# Patient Record
Sex: Male | Born: 1975 | Race: Asian | Hispanic: No | Marital: Married | Smoking: Never smoker
Health system: Southern US, Community
[De-identification: ages and names within clinical notes are randomized; demographics above are authoritative.]

## PROBLEM LIST (undated history)

## (undated) DIAGNOSIS — E119 Type 2 diabetes mellitus without complications: Secondary | ICD-10-CM

## (undated) DIAGNOSIS — D649 Anemia, unspecified: Secondary | ICD-10-CM

## (undated) DIAGNOSIS — A159 Respiratory tuberculosis unspecified: Secondary | ICD-10-CM

## (undated) HISTORY — DX: Respiratory tuberculosis unspecified: A15.9

## (undated) HISTORY — PX: VASECTOMY: SHX75

---

## 2020-04-20 ENCOUNTER — Ambulatory Visit (INDEPENDENT_AMBULATORY_CARE_PROVIDER_SITE_OTHER): Payer: BC Managed Care – PPO | Admitting: Family Medicine

## 2020-04-20 ENCOUNTER — Other Ambulatory Visit: Payer: Self-pay

## 2020-04-20 ENCOUNTER — Encounter: Payer: Self-pay | Admitting: Family Medicine

## 2020-04-20 VITALS — BP 124/70 | HR 89 | Temp 98.0°F | Resp 18 | Ht 65.0 in | Wt 252.6 lb

## 2020-04-20 DIAGNOSIS — E785 Hyperlipidemia, unspecified: Secondary | ICD-10-CM | POA: Diagnosis not present

## 2020-04-20 DIAGNOSIS — Z6841 Body Mass Index (BMI) 40.0 and over, adult: Secondary | ICD-10-CM | POA: Diagnosis not present

## 2020-04-20 DIAGNOSIS — E1169 Type 2 diabetes mellitus with other specified complication: Secondary | ICD-10-CM | POA: Insufficient documentation

## 2020-04-20 DIAGNOSIS — E1165 Type 2 diabetes mellitus with hyperglycemia: Secondary | ICD-10-CM | POA: Insufficient documentation

## 2020-04-20 DIAGNOSIS — Z0001 Encounter for general adult medical examination with abnormal findings: Secondary | ICD-10-CM | POA: Diagnosis not present

## 2020-04-20 MED ORDER — ATORVASTATIN CALCIUM 40 MG PO TABS: 40.0000 mg | ORAL_TABLET | Freq: Every day | ORAL | 3 refills | Status: DC

## 2020-04-20 MED ORDER — SITAGLIPTIN PHOSPHATE 100 MG PO TABS: 100.0000 mg | ORAL_TABLET | Freq: Every day | ORAL | 3 refills | Status: DC

## 2020-04-20 MED ORDER — METFORMIN HCL 1000 MG PO TABS: 1000.0000 mg | ORAL_TABLET | Freq: Two times a day (BID) | ORAL | 3 refills | Status: DC

## 2020-04-20 NOTE — Assessment & Plan Note (Signed)
Check A1c.  Continue Metformin 1000 mg twice daily and Januvia 100 mg daily.  Discussed diet and exercise.

## 2020-04-20 NOTE — Assessment & Plan Note (Signed)
Check lipids.  Continue Lipitor 40 mg daily.  Discussed lifestyle modifications.

## 2020-04-20 NOTE — Patient Instructions (Signed)
It was very nice to see you today!  We will check blood work today.  I will refill your medications.  I will see you back in 3 to 6 months depending on the results of your blood work.  Please come back to see me sooner if needed.  Take care, Dr Jerline Pain  Please try these tips to maintain a healthy lifestyle:   Eat at least 3 REAL meals and 1-2 snacks per day.  Aim for no more than 5 hours between eating.  If you eat breakfast, please do so within one hour of getting up.    Each meal should contain half fruits/vegetables, one quarter protein, and one quarter carbs (no bigger than a computer mouse)   Cut down on sweet beverages. This includes juice, soda, and sweet tea.     Drink at least 1 glass of water with each meal and aim for at least 8 glasses per day   Exercise at least 150 minutes every week.    Preventive Care 51-30 Years Old, Male Preventive care refers to lifestyle choices and visits with your health care provider that can promote health and wellness. This includes:  A yearly physical exam. This is also called an annual well check.  Regular dental and eye exams.  Immunizations.  Screening for certain conditions.  Healthy lifestyle choices, such as eating a healthy diet, getting regular exercise, not using drugs or products that contain nicotine and tobacco, and limiting alcohol use. What can I expect for my preventive care visit? Physical exam Your health care provider will check:  Height and weight. These may be used to calculate body mass index (BMI), which is a measurement that tells if you are at a healthy weight.  Heart rate and blood pressure.  Your skin for abnormal spots. Counseling Your health care provider may ask you questions about:  Alcohol, tobacco, and drug use.  Emotional well-being.  Home and relationship well-being.  Sexual activity.  Eating habits.  Work and work Statistician. What immunizations do I need?  Influenza (flu)  vaccine  This is recommended every year. Tetanus, diphtheria, and pertussis (Tdap) vaccine  You may need a Td booster every 10 years. Varicella (chickenpox) vaccine  You may need this vaccine if you have not already been vaccinated. Zoster (shingles) vaccine  You may need this after age 23. Measles, mumps, and rubella (MMR) vaccine  You may need at least one dose of MMR if you were born in 1957 or later. You may also need a second dose. Pneumococcal conjugate (PCV13) vaccine  You may need this if you have certain conditions and were not previously vaccinated. Pneumococcal polysaccharide (PPSV23) vaccine  You may need one or two doses if you smoke cigarettes or if you have certain conditions. Meningococcal conjugate (MenACWY) vaccine  You may need this if you have certain conditions. Hepatitis A vaccine  You may need this if you have certain conditions or if you travel or work in places where you may be exposed to hepatitis A. Hepatitis B vaccine  You may need this if you have certain conditions or if you travel or work in places where you may be exposed to hepatitis B. Haemophilus influenzae type b (Hib) vaccine  You may need this if you have certain risk factors. Human papillomavirus (HPV) vaccine  If recommended by your health care provider, you may need three doses over 6 months. You may receive vaccines as individual doses or as more than one vaccine together in one  shot (combination vaccines). Talk with your health care provider about the risks and benefits of combination vaccines. What tests do I need? Blood tests  Lipid and cholesterol levels. These may be checked every 5 years, or more frequently if you are over 28 years old.  Hepatitis C test.  Hepatitis B test. Screening  Lung cancer screening. You may have this screening every year starting at age 61 if you have a 30-pack-year history of smoking and currently smoke or have quit within the past 15  years.  Prostate cancer screening. Recommendations will vary depending on your family history and other risks.  Colorectal cancer screening. All adults should have this screening starting at age 46 and continuing until age 40. Your health care provider may recommend screening at age 66 if you are at increased risk. You will have tests every 1-10 years, depending on your results and the type of screening test.  Diabetes screening. This is done by checking your blood sugar (glucose) after you have not eaten for a while (fasting). You may have this done every 1-3 years.  Sexually transmitted disease (STD) testing. Follow these instructions at home: Eating and drinking  Eat a diet that includes fresh fruits and vegetables, whole grains, lean protein, and low-fat dairy products.  Take vitamin and mineral supplements as recommended by your health care provider.  Do not drink alcohol if your health care provider tells you not to drink.  If you drink alcohol: ? Limit how much you have to 0-2 drinks a day. ? Be aware of how much alcohol is in your drink. In the [place] one drink equals one 12 oz bottle of beer (355 mL), one 5 oz glass of wine (148 mL), or one 1 oz glass of hard liquor (44 mL). Lifestyle  Take daily care of your teeth and gums.  Stay active. Exercise for at least 30 minutes on 5 or more days each week.  Do not use any products that contain nicotine or tobacco, such as cigarettes, e-cigarettes, and chewing tobacco. If you need help quitting, ask your health care provider.  If you are sexually active, practice safe sex. Use a condom or other form of protection to prevent STIs (sexually transmitted infections).  Talk with your health care provider about taking a low-dose aspirin every day starting at age 53. What's next?  Go to your health care provider once a year for a well check visit.  Ask your health care provider how often you should have your eyes and teeth  checked.  Stay up to date on all vaccines. This information is not intended to replace advice given to you by your health care provider. Make sure you discuss any questions you have with your health care provider. Document Revised: 04/05/2018 Document Reviewed: 04/05/2018 Elsevier Patient Education  2020 [employer].

## 2020-04-20 NOTE — Progress Notes (Signed)
Chief Complaint:  Cody Marks is a 44 y.o. male who presents today for his annual comprehensive physical exam.  He is a new patient.   Assessment/Plan:  Chronic Problems Addressed Today: Type 2 diabetes mellitus with hyperglycemia (HCC) Check A1c.  Continue Metformin 1000 mg twice daily and Januvia 100 mg daily.  Discussed diet and exercise.  Dyslipidemia Check lipids.  Continue Lipitor 40 mg daily.  Discussed lifestyle modifications.  Body mass index is 42.03 kg/m. / Obese  BMI Metric Follow Up - 04/20/20 1552      BMI Metric Follow Up-Please document annually   BMI Metric Follow Up Education provided            Preventative Healthcare: Will obtain records from previous PCP.  Check CBC, CMET, TSH, lipid panel.  Patient Counseling(The following topics were reviewed and/or handout was given):  -Nutrition: Stressed importance of moderation in sodium/caffeine intake, saturated fat and cholesterol, caloric balance, sufficient intake of fresh fruits, vegetables, and fiber.  -Stressed the importance of regular exercise.   -Substance Abuse: Discussed cessation/primary prevention of tobacco, alcohol, or other drug use; driving or other dangerous activities under the influence; availability of treatment for abuse.   -Injury prevention: Discussed safety belts, safety helmets, smoke detector, smoking near bedding or upholstery.   -Sexuality: Discussed sexually transmitted diseases, partner selection, use of condoms, avoidance of unintended pregnancy and contraceptive alternatives.   -Dental health: Discussed importance of regular tooth brushing, flossing, and dental visits.  -Health maintenance and immunizations reviewed. Please refer to Health maintenance section.  Return to care in 1 year for next preventative visit.     Subjective:  HPI:  He has no acute complaints today.   Lifestyle Diet: None specific.  Exercise: None specific.   Depression screen PHQ 2/9 04/20/2020   Decreased Interest 0  Down, Depressed, Hopeless 0  PHQ - 2 Score 0    Health Maintenance Due  Topic Date Due  . HEMOGLOBIN A1C  Never done  . Hepatitis C Screening  Never done  . PNEUMOCOCCAL POLYSACCHARIDE VACCINE AGE 23-64 HIGH RISK  Never done  . FOOT EXAM  Never done  . OPHTHALMOLOGY EXAM  Never done  . HIV Screening  Never done  . TETANUS/TDAP  Never done     ROS: Per HPI, otherwise a complete review of systems was negative.   PMH:  The following were reviewed and entered/updated in epic: Past Medical History:  Diagnosis Date  . Tuberculosis    Patient Active Problem List   Diagnosis Date Noted  . Dyslipidemia 04/20/2020  . Type 2 diabetes mellitus with hyperglycemia (HCC) 04/20/2020   History reviewed. No pertinent surgical history.  Family History  Problem Relation Age of Onset  . COPD Mother     Medications- reviewed and updated Current Outpatient Medications  Medication Sig Dispense Refill  . atorvastatin (LIPITOR) 40 MG tablet Take 1 tablet (40 mg total) by mouth daily. 90 tablet 3  . metFORMIN (GLUCOPHAGE) 1000 MG tablet Take 1 tablet (1,000 mg total) by mouth 2 (two) times daily. 90 tablet 3  . sitaGLIPtin (JANUVIA) 100 MG tablet Take 1 tablet (100 mg total) by mouth daily. 90 tablet 3   No current facility-administered medications for this visit.    Allergies-reviewed and updated No Known Allergies  Social History   Socioeconomic History  . Marital status: Married    Spouse name: Not on file  . Number of children: Not on file  . Years of education: Not on file  .  Highest education level: Not on file  Occupational History  . Not on file  Tobacco Use  . Smoking status: Never Smoker  . Smokeless tobacco: Never Used  Substance and Sexual Activity  . Alcohol use: Not on file  . Drug use: Not on file  . Sexual activity: Not on file  Other Topics Concern  . Not on file  Social History Narrative  . Not on file   Social Determinants of  Health   Financial Resource Strain: Not on file  Food Insecurity: Not on file  Transportation Needs: Not on file  Physical Activity: Not on file  Stress: Not on file  Social Connections: Not on file        Objective:  Physical Exam: BP 124/70   Pulse 89   Temp 98 F (36.7 C) (Temporal)   Resp 18   Ht 5\' 5"  (1.651 m)   Wt 252 lb 9.6 oz (114.6 kg)   SpO2 96%   BMI 42.03 kg/m   Body mass index is 42.03 kg/m. Wt Readings from Last 3 Encounters:  04/20/20 252 lb 9.6 oz (114.6 kg)   Gen: NAD, resting comfortably HEENT: TMs normal bilaterally. OP clear. No thyromegaly noted.  CV: RRR with no murmurs appreciated Pulm: NWOB, CTAB with no crackles, wheezes, or rhonchi GI: Normal bowel sounds present. Soft, Nontender, Nondistended. MSK: no edema, cyanosis, or clubbing noted Skin: warm, dry Neuro: CN2-12 grossly intact. Strength 5/5 in upper and lower extremities. Reflexes symmetric and intact bilaterally.  Psych: Normal affect and thought content     [name]. 04/22/20, MD 04/20/2020 3:53 PM

## 2020-04-21 LAB — CBC
HCT: 38.7 % — ABNORMAL LOW (ref 39.0–52.0)
Hemoglobin: 12.8 g/dL — ABNORMAL LOW (ref 13.0–17.0)
MCHC: 33 g/dL (ref 30.0–36.0)
MCV: 82.9 fl (ref 78.0–100.0)
Platelets: 275 10*3/uL (ref 150.0–400.0)
RBC: 4.67 Mil/uL (ref 4.22–5.81)
RDW: 13.6 % (ref 11.5–15.5)
WBC: 9.4 10*3/uL (ref 4.0–10.5)

## 2020-04-21 LAB — LIPID PANEL
Cholesterol: 160 mg/dL (ref 0–200)
HDL: 41.7 mg/dL (ref >39.00)
LDL Cholesterol: 94 mg/dL (ref 0–99)
NonHDL: 118.55
Total CHOL/HDL Ratio: 4
Triglycerides: 122 mg/dL (ref 0.0–149.0)
VLDL: 24.4 mg/dL (ref 0.0–40.0)

## 2020-04-21 LAB — COMPREHENSIVE METABOLIC PANEL
ALT: 23 U/L (ref 0–53)
AST: 17 U/L (ref 0–37)
Albumin: 4.1 g/dL (ref 3.5–5.2)
Alkaline Phosphatase: 82 U/L (ref 39–117)
BUN: 13 mg/dL (ref 6–23)
CO2: 31 mEq/L (ref 19–32)
Calcium: 9.5 mg/dL (ref 8.4–10.5)
Chloride: 98 mEq/L (ref 96–112)
Creatinine, Ser: 0.82 mg/dL (ref 0.40–1.50)
GFR: 108.35 mL/min (ref >60.00)
Glucose, Bld: 120 mg/dL — ABNORMAL HIGH (ref 70–99)
Potassium: 4.3 mEq/L (ref 3.5–5.1)
Sodium: 134 mEq/L — ABNORMAL LOW (ref 135–145)
Total Bilirubin: 0.4 mg/dL (ref 0.2–1.2)
Total Protein: 7.8 g/dL (ref 6.0–8.3)

## 2020-04-21 LAB — MICROALBUMIN / CREATININE URINE RATIO
Creatinine,U: 73.5 mg/dL
Microalb Creat Ratio: 1 mg/g (ref 0.0–30.0)
Microalb, Ur: <0.7 mg/dL (ref 0.0–1.9)

## 2020-04-21 LAB — LDL CHOLESTEROL, DIRECT: Direct LDL: 105 mg/dL

## 2020-04-21 LAB — HEMOGLOBIN A1C: Hgb A1c MFr Bld: 8.7 % — ABNORMAL HIGH (ref 4.6–6.5)

## 2020-04-21 LAB — TSH: TSH: 2.58 u[IU]/mL (ref 0.35–4.50)

## 2020-04-21 NOTE — Progress Notes (Signed)
Please inform patient of the following:  A1c and blood sugar are high but everything else is stable. Recommend addition on jardiance 10mg  daily to his medications to help lower sugar. Please send in if he is willing to start. I would like to see him back in 3 months to recheck.  . Katina Degree, MD 04/21/2020 1:24 PM

## 2020-04-28 ENCOUNTER — Other Ambulatory Visit: Payer: Self-pay | Admitting: *Deleted

## 2020-04-28 MED ORDER — EMPAGLIFLOZIN 10 MG PO TABS: 10.0000 mg | ORAL_TABLET | Freq: Every day | ORAL | 0 refills | Status: DC

## 2020-08-18 ENCOUNTER — Encounter: Payer: Self-pay | Admitting: Family Medicine

## 2020-08-18 ENCOUNTER — Other Ambulatory Visit: Payer: Self-pay

## 2020-08-18 ENCOUNTER — Ambulatory Visit (INDEPENDENT_AMBULATORY_CARE_PROVIDER_SITE_OTHER): Payer: BC Managed Care – PPO | Admitting: Family Medicine

## 2020-08-18 VITALS — BP 120/77 | HR 92 | Temp 97.7°F | Ht 65.0 in | Wt 254.2 lb

## 2020-08-18 DIAGNOSIS — E785 Hyperlipidemia, unspecified: Secondary | ICD-10-CM | POA: Diagnosis not present

## 2020-08-18 DIAGNOSIS — E1165 Type 2 diabetes mellitus with hyperglycemia: Secondary | ICD-10-CM | POA: Diagnosis not present

## 2020-08-18 LAB — POCT GLYCOSYLATED HEMOGLOBIN (HGB A1C): Hemoglobin A1C: 8.3 % — AB (ref 4.0–5.6)

## 2020-08-18 NOTE — Progress Notes (Signed)
   Jair Lindblad is a 45 y.o. male who presents today for an office visit.  Assessment/Plan:  Chronic Problems Addressed Today: Type 2 diabetes mellitus with hyperglycemia (HCC) A1c is better but still above goal.  He has been working on diet and exercise.  We will continue current regimen with metformin 1000 mg twice daily and Januvia 20 mg daily.  He was  unable to afford Jardiance due to cost.  Dyslipidemia Doing well with Lipitor 40 mg daily.    Subjective:  HPI:  See A/p.         Objective:  Physical Exam: BP 120/77   Pulse 92   Temp 97.7 F (36.5 C) (Temporal)   Ht 5\' 5"  (1.651 m)   Wt 254 lb 3.2 oz (115.3 kg)   SpO2 97%   BMI 42.30 kg/m   Gen: No acute distress, resting comfortably ro: Grossly normal, moves all extremities Psych: Normal affect and thought content      Thalya Fouche M. , MD 08/18/2020 3:31 PM

## 2020-08-18 NOTE — Assessment & Plan Note (Signed)
Doing well with Lipitor 40 mg daily.

## 2020-08-18 NOTE — Assessment & Plan Note (Signed)
A1c is better but still above goal.  He has been working on diet and exercise.  We will continue current regimen with metformin 1000 mg twice daily and Januvia 20 mg daily.  He was  unable to afford Jardiance due to cost.

## 2020-08-18 NOTE — Patient Instructions (Addendum)
It was very nice to see you today!  Your blood sugar looks better.  Please continue working on diet and exercise.  No medication changes today.  I will see you back in 3 to 6 months to recheck A1c.    Please check with your insurance to see if you can get a formulary in case we need to make adjustments in the future.   Come back to see me sooner if needed.  Take care, Dr Jimmey Ralph  PLEASE NOTE:  If you had any lab tests please let us know if you have not heard back within a few days. You may see your results on mychart before we have a chance to review them but we will give you a call once they are reviewed by [place]. If we ordered any referrals today, please let us know if you have not heard from their office within the next week.   Please try these tips to maintain a healthy lifestyle:   Eat at least 3 REAL meals and 1-2 snacks per day.  Aim for no more than 5 hours between eating.  If you eat breakfast, please do so within one hour of getting up.    Each meal should contain half fruits/vegetables, one quarter protein, and one quarter carbs (no bigger than a computer mouse)   Cut down on sweet beverages. This includes juice, soda, and sweet tea.     Drink at least 1 glass of water with each meal and aim for at least 8 glasses per day   Exercise at least 150 minutes every week.

## 2021-01-01 ENCOUNTER — Ambulatory Visit (INDEPENDENT_AMBULATORY_CARE_PROVIDER_SITE_OTHER): Payer: BC Managed Care – PPO | Admitting: Family Medicine

## 2021-01-01 ENCOUNTER — Other Ambulatory Visit: Payer: Self-pay

## 2021-01-01 ENCOUNTER — Encounter: Payer: Self-pay | Admitting: Family Medicine

## 2021-01-01 VITALS — BP 130/84 | HR 89 | Temp 98.0°F | Ht 65.0 in | Wt 246.0 lb

## 2021-01-01 DIAGNOSIS — Z3009 Encounter for other general counseling and advice on contraception: Secondary | ICD-10-CM

## 2021-01-01 DIAGNOSIS — E1165 Type 2 diabetes mellitus with hyperglycemia: Secondary | ICD-10-CM

## 2021-01-01 LAB — POCT GLYCOSYLATED HEMOGLOBIN (HGB A1C): Hemoglobin A1C: 8.2 % — AB (ref 4.0–5.6)

## 2021-01-01 MED ORDER — TADALAFIL 10 MG PO TABS: 5.0000 mg | ORAL_TABLET | ORAL | 1 refills | Status: DC | PRN

## 2021-01-01 NOTE — Patient Instructions (Signed)
It was very nice to see you today!  I will refer you to see a urologist to discuss a vasectomy.  Please start the Cialis.  I will see back in 3 to 6 months.  Come back to see me sooner if needed.  Take care, Dr Jimmey Ralph  PLEASE NOTE:  If you had any lab tests please let us know if you have not heard back within a few days. You may see your results on mychart before we have a chance to review them but we will give you a call once they are reviewed by [place]. If we ordered any referrals today, please let us know if you have not heard from their office within the next week.   Please try these tips to maintain a healthy lifestyle:  Eat at least 3 REAL meals and 1-2 snacks per day.  Aim for no more than 5 hours between eating.  If you eat breakfast, please do so within one hour of getting up.   Each meal should contain half fruits/vegetables, one quarter protein, and one quarter carbs (no bigger than a computer mouse)  Cut down on sweet beverages. This includes juice, soda, and sweet tea.   Drink at least 1 glass of water with each meal and aim for at least 8 glasses per day  Exercise at least 150 minutes every week.

## 2021-01-01 NOTE — Progress Notes (Signed)
   Cody Marks is a 45 y.o. male who presents today for an office visit.  Assessment/Plan:  Chronic Problems Addressed Today: Morbid obesity (HCC) BMI 48.  He is working on diet and exercise.  May benefit from GLP-1 agonist or Mounjaro in the future.  We will follow-up again in 3 to 6 months.  Type 2 diabetes mellitus with hyperglycemia (HCC) A1c stable at 8.3.  Discussed with patient this is slightly above goal.  He has been having trouble getting his medications paid for by his insurance.  We will continue metformin 1000 mg twice daily and recheck again in 3 to 6 months.  We discussed lifestyle modifications.  He will be switching insurances at the start of the nadir.  May consider adding on Ozempic or Mounjaro at that time.  Flu vaccine given today.     Subjective:  HPI:  See A/p for status of chronic conditions.   As part of the plan to lose weight he has also been adjusting his diet and caloric intake.  Also, he has an issue with maintaining, and/or acquiring an erection, depending on the day. Additionally he expresses interest in having a vasectomy.         Objective:  Physical Exam: BP 130/84   Pulse 89   Temp 98 F (36.7 C) (Temporal)   Ht 5\' 5"  (1.651 m)   Wt 246 lb (111.6 kg)   SpO2 99%   BMI 40.94 kg/m   Gen: No acute distress, resting comfortably CV: Regular rate and rhythm with no murmurs appreciated Pulm: Normal work of breathing, clear to auscultation bilaterally with no crackles, wheezes, or rhonchi Neuro: Grossly normal, moves all extremities Psych: Normal affect and thought content      I,Jordan Kelly,acting as a scribe for , MD.,have documented all relevant documentation on the behalf of Jacquiline Doe, MD,as directed by  Jacquiline Doe, MD while in the presence of Jacquiline Doe, MD.  I, Jacquiline Doe, MD, have reviewed all documentation for this visit. The documentation on 01/01/21 for the exam, diagnosis, procedures, and orders are all  accurate and complete.  03/03/21. Katina Degree, MD 01/01/2021 3:43 PM

## 2021-01-01 NOTE — Assessment & Plan Note (Signed)
BMI 48.  He is working on diet and exercise.  May benefit from GLP-1 agonist or Mounjaro in the future.  We will follow-up again in 3 to 6 months.

## 2021-01-01 NOTE — Assessment & Plan Note (Signed)
A1c stable at 8.3.  Discussed with patient this is slightly above goal.  He has been having trouble getting his medications paid for by his insurance.  We will continue metformin 1000 mg twice daily and recheck again in 3 to 6 months.  We discussed lifestyle modifications.  He will be switching insurances at the start of the nadir.  May consider adding on Ozempic or Mounjaro at that time.

## 2021-05-07 ENCOUNTER — Encounter: Payer: Self-pay | Admitting: Family Medicine

## 2021-05-07 ENCOUNTER — Other Ambulatory Visit: Payer: Self-pay

## 2021-05-07 ENCOUNTER — Ambulatory Visit (INDEPENDENT_AMBULATORY_CARE_PROVIDER_SITE_OTHER): Payer: BC Managed Care – PPO | Admitting: Family Medicine

## 2021-05-07 DIAGNOSIS — E1165 Type 2 diabetes mellitus with hyperglycemia: Secondary | ICD-10-CM | POA: Diagnosis not present

## 2021-05-07 LAB — POCT GLYCOSYLATED HEMOGLOBIN (HGB A1C): Hemoglobin A1C: 9.5 % — AB (ref 4.0–5.6)

## 2021-05-07 MED ORDER — OZEMPIC (0.25 OR 0.5 MG/DOSE) 2 MG/1.5ML ~~LOC~~ SOPN: 0.2500 mg | PEN_INJECTOR | SUBCUTANEOUS | 3 refills | Status: DC

## 2021-05-07 NOTE — Assessment & Plan Note (Signed)
A1c uncontrolled at 9.5.  We will start Ozempic 0.25 mg weekly for 4 weeks and then increase to 0.5 mg weekly.  Discussed potential side effects.  He will follow-up in a few weeks via MyChart.  Follow-up in 3 months to recheck A1c.

## 2021-05-07 NOTE — Patient Instructions (Addendum)
It was very nice to see you today!  Your A1c is 9.5.  We need to work to get this lower.  We will start Ozempic.  Please take 0.25 mg weekly for 4 weeks and then increase to 0.5 mg weekly.  Please send me a message in a few weeks to let me know how this is working.  Please continue to work on diet and exercise.   Please come back in 3 months or sooner if needed.   Take care, Dr Jimmey Ralph  PLEASE NOTE:  If you had any lab tests please let us know if you have not heard back within a few days. You may see your results on mychart before we have a chance to review them but we will give you a call once they are reviewed by [place]. If we ordered any referrals today, please let us know if you have not heard from their office within the next week.   Please try these tips to maintain a healthy lifestyle:  Eat at least 3 REAL meals and 1-2 snacks per day.  Aim for no more than 5 hours between eating.  If you eat breakfast, please do so within one hour of getting up.   Each meal should contain half fruits/vegetables, one quarter protein, and one quarter carbs (no bigger than a computer mouse)  Cut down on sweet beverages. This includes juice, soda, and sweet tea.   Drink at least 1 glass of water with each meal and aim for at least 8 glasses per day  Exercise at least 150 minutes every week.

## 2021-05-07 NOTE — Assessment & Plan Note (Signed)
Down about 8 lbs since last visit. Discussed lifestyle modifications.

## 2021-05-07 NOTE — Progress Notes (Signed)
° °  Cody Marks is a 46 y.o. male who presents today for an office visit.  Assessment/Plan:  Chronic Problems Addressed Today: Morbid obesity (HCC) Down about 8 lbs since last visit. Discussed lifestyle modifications.   Type 2 diabetes mellitus with hyperglycemia (HCC) A1c uncontrolled at 9.5.  We will start Ozempic 0.25 mg weekly for 4 weeks and then increase to 0.5 mg weekly.  Discussed potential side effects.  He will follow-up in a few weeks via MyChart.  Follow-up in 3 months to recheck A1c.    Subjective:  HPI:  See A/p for status of chronic conditions.        Objective:  Physical Exam: BP 134/82 (BP Location: Left Arm, Patient Position: Sitting, Cuff Size: Large)    Pulse 92    Temp (!) 97.5 F (36.4 C) (Temporal)    Ht 5\' 5"  (1.651 m)    Wt 238 lb (108 kg)    SpO2 98%    BMI 39.61 kg/m   Gen: No acute distress, resting comfortably CV: Regular rate and rhythm with no murmurs appreciated Pulm: Normal work of breathing, clear to auscultation bilaterally with no crackles, wheezes, or rhonchi Neuro: Grossly normal, moves all extremities Psych: Normal affect and thought content      Cody Marks M. Jerline Pain, MD 05/07/2021 3:40 PM

## 2021-05-11 ENCOUNTER — Telehealth: Payer: Self-pay | Admitting: *Deleted

## 2021-05-11 NOTE — Telephone Encounter (Signed)
PA (Key: [id number]) Rx #: [id number] Ozempic (0.25 or 0.5 MG/DOSE) 2MG /1.5ML pen-injectors Waiting for determination

## 2021-05-13 NOTE — Telephone Encounter (Signed)
PA Rx Ozempic approved from 05/11/2021 till 05/11/2022 Pharmacy notified  stated Rx is more then $700 with approval   Patient notified, will call insurance for information of medication changes and approval

## 2021-06-08 ENCOUNTER — Telehealth (INDEPENDENT_AMBULATORY_CARE_PROVIDER_SITE_OTHER): Payer: BC Managed Care – PPO | Admitting: Physician Assistant

## 2021-06-08 VITALS — Ht 65.0 in

## 2021-06-08 DIAGNOSIS — R6889 Other general symptoms and signs: Secondary | ICD-10-CM | POA: Diagnosis not present

## 2021-06-08 DIAGNOSIS — J029 Acute pharyngitis, unspecified: Secondary | ICD-10-CM

## 2021-06-08 NOTE — Progress Notes (Signed)
° °  Virtual Visit via Video Note  I connected with  Cody Marks  on 06/08/21 at  8:30 AM EST by a video enabled telemedicine application and verified that I am speaking with the correct person using two identifiers.  Location: Patient: Parked car driving to work  Provider: Nature conservation officer at [employer] Persons present: Patient and myself   I discussed the limitations of evaluation and management by telemedicine and the availability of in person appointments. The patient expressed understanding and agreed to proceed.   History of Present Illness:  Chief complaint: ST and cough Symptom onset: 2 days ago Pertinent positives: Some body aches, fatigue, chills Pertinent negatives: SOB, CP, N/V/D, fever Treatments tried: Tylenol or Ibuprofen at home Vaccine status: Pfizer x 3 Sick exposure: Family in household with similar symptoms this week     Observations/Objective:   Gen: Awake, alert, no acute distress Resp: Breathing is even and non-labored Psych: calm/pleasant demeanor Neuro: Alert and Oriented x 3, + facial symmetry, speech is clear.   Assessment and Plan: 1. Flu-like symptoms 2. Acute pharyngitis, unspecified etiology -Encouraged pt to come to our office for strep, flu, and COVID-19 testing, but he said he needed to go into work. -At this time, would recommend conservative tx for likely viral infection. -Tylenol / ibuprofen prn, fluids, nasal saline, Delsym OTC -Recheck in person if worse or no improvement.   Follow Up Instructions:    I discussed the assessment and treatment plan with the patient. The patient was provided an opportunity to ask questions and all were answered. The patient agreed with the plan and demonstrated an understanding of the instructions.   The patient was advised to call back or seek an in-person evaluation if the symptoms worsen or if the condition fails to improve as anticipated.  Asheley Hellberg M Casilda Pickerill, PA-C

## 2021-06-15 ENCOUNTER — Telehealth: Payer: Self-pay | Admitting: Family Medicine

## 2021-06-15 ENCOUNTER — Emergency Department (HOSPITAL_BASED_OUTPATIENT_CLINIC_OR_DEPARTMENT_OTHER)
Admission: EM | Admit: 2021-06-15 | Discharge: 2021-06-15 | Payer: BC Managed Care – PPO | Attending: Emergency Medicine | Admitting: Emergency Medicine

## 2021-06-15 ENCOUNTER — Emergency Department (HOSPITAL_BASED_OUTPATIENT_CLINIC_OR_DEPARTMENT_OTHER): Payer: BC Managed Care – PPO

## 2021-06-15 ENCOUNTER — Encounter (HOSPITAL_BASED_OUTPATIENT_CLINIC_OR_DEPARTMENT_OTHER): Payer: Self-pay

## 2021-06-15 ENCOUNTER — Encounter: Payer: Self-pay | Admitting: Family Medicine

## 2021-06-15 ENCOUNTER — Ambulatory Visit (INDEPENDENT_AMBULATORY_CARE_PROVIDER_SITE_OTHER): Payer: BC Managed Care – PPO | Admitting: Family Medicine

## 2021-06-15 ENCOUNTER — Other Ambulatory Visit: Payer: Self-pay

## 2021-06-15 VITALS — BP 98/68 | HR 118 | Temp 97.7°F | Ht 65.0 in | Wt 233.2 lb

## 2021-06-15 DIAGNOSIS — Z794 Long term (current) use of insulin: Secondary | ICD-10-CM | POA: Insufficient documentation

## 2021-06-15 DIAGNOSIS — L02215 Cutaneous abscess of perineum: Secondary | ICD-10-CM

## 2021-06-15 DIAGNOSIS — A419 Sepsis, unspecified organism: Secondary | ICD-10-CM | POA: Insufficient documentation

## 2021-06-15 DIAGNOSIS — N493 Fournier gangrene: Secondary | ICD-10-CM | POA: Diagnosis not present

## 2021-06-15 DIAGNOSIS — E871 Hypo-osmolality and hyponatremia: Secondary | ICD-10-CM | POA: Diagnosis not present

## 2021-06-15 DIAGNOSIS — E1165 Type 2 diabetes mellitus with hyperglycemia: Secondary | ICD-10-CM | POA: Insufficient documentation

## 2021-06-15 DIAGNOSIS — R000 Tachycardia, unspecified: Secondary | ICD-10-CM | POA: Insufficient documentation

## 2021-06-15 DIAGNOSIS — D72829 Elevated white blood cell count, unspecified: Secondary | ICD-10-CM | POA: Insufficient documentation

## 2021-06-15 DIAGNOSIS — R509 Fever, unspecified: Secondary | ICD-10-CM | POA: Diagnosis present

## 2021-06-15 DIAGNOSIS — Z20822 Contact with and (suspected) exposure to covid-19: Secondary | ICD-10-CM | POA: Diagnosis not present

## 2021-06-15 LAB — COMPREHENSIVE METABOLIC PANEL
ALT: 28 U/L (ref 0–44)
AST: 21 U/L (ref 15–41)
Albumin: 3.7 g/dL (ref 3.5–5.0)
Alkaline Phosphatase: 70 U/L (ref 38–126)
Anion gap: 10 (ref 5–15)
BUN: 13 mg/dL (ref 6–20)
CO2: 22 mmol/L (ref 22–32)
Calcium: 9.3 mg/dL (ref 8.9–10.3)
Chloride: 101 mmol/L (ref 98–111)
Creatinine, Ser: 0.77 mg/dL (ref 0.61–1.24)
GFR, Estimated: >60 mL/min (ref >60)
Glucose, Bld: 272 mg/dL — ABNORMAL HIGH (ref 70–99)
Potassium: 3.8 mmol/L (ref 3.5–5.1)
Sodium: 133 mmol/L — ABNORMAL LOW (ref 135–145)
Total Bilirubin: 0.7 mg/dL (ref 0.3–1.2)
Total Protein: 7.4 g/dL (ref 6.5–8.1)

## 2021-06-15 LAB — CBC WITH DIFFERENTIAL/PLATELET
Abs Immature Granulocytes: 0.04 10*3/uL (ref 0.00–0.07)
Basophils Absolute: 0 10*3/uL (ref 0.0–0.1)
Basophils Relative: 0 %
Eosinophils Absolute: 0 10*3/uL (ref 0.0–0.5)
Eosinophils Relative: 0 %
HCT: 37.2 % — ABNORMAL LOW (ref 39.0–52.0)
Hemoglobin: 12 g/dL — ABNORMAL LOW (ref 13.0–17.0)
Immature Granulocytes: 0 %
Lymphocytes Relative: 21 %
Lymphs Abs: 2.6 10*3/uL (ref 0.7–4.0)
MCH: 26.3 pg (ref 26.0–34.0)
MCHC: 32.3 g/dL (ref 30.0–36.0)
MCV: 81.6 fL (ref 80.0–100.0)
Monocytes Absolute: 0.7 10*3/uL (ref 0.1–1.0)
Monocytes Relative: 6 %
Neutro Abs: 8.9 10*3/uL — ABNORMAL HIGH (ref 1.7–7.7)
Neutrophils Relative %: 73 %
Platelets: 271 10*3/uL (ref 150–400)
RBC: 4.56 MIL/uL (ref 4.22–5.81)
RDW: 12.9 % (ref 11.5–15.5)
WBC: 12.3 10*3/uL — ABNORMAL HIGH (ref 4.0–10.5)
nRBC: 0 % (ref 0.0–0.2)

## 2021-06-15 LAB — RESP PANEL BY RT-PCR (FLU A&B, COVID) ARPGX2
Influenza A by PCR: NEGATIVE
Influenza B by PCR: NEGATIVE
SARS Coronavirus 2 by RT PCR: NEGATIVE

## 2021-06-15 LAB — URINALYSIS, ROUTINE W REFLEX MICROSCOPIC
Bilirubin Urine: NEGATIVE
Glucose, UA: >1000 mg/dL — AB
Hgb urine dipstick: NEGATIVE
Ketones, ur: NEGATIVE mg/dL
Leukocytes,Ua: NEGATIVE
Nitrite: NEGATIVE
Protein, ur: NEGATIVE mg/dL
Specific Gravity, Urine: 1.032 — ABNORMAL HIGH (ref 1.005–1.030)
pH: 6 (ref 5.0–8.0)

## 2021-06-15 LAB — PROTIME-INR
INR: 1.1 (ref 0.8–1.2)
Prothrombin Time: 14.4 seconds (ref 11.4–15.2)

## 2021-06-15 LAB — LACTIC ACID, PLASMA: Lactic Acid, Venous: 0.9 mmol/L (ref 0.5–1.9)

## 2021-06-15 MED ORDER — VANCOMYCIN HCL IN DEXTROSE 1-5 GM/200ML-% IV SOLN
1000.0000 mg | Freq: Once | INTRAVENOUS | Status: AC
  Administered 2021-06-15: 1000 mg via INTRAVENOUS
  Filled 2021-06-15: qty 200

## 2021-06-15 MED ORDER — PIPERACILLIN-TAZOBACTAM 3.375 G IVPB 30 MIN
3.3750 g | Freq: Once | INTRAVENOUS | Status: AC
  Administered 2021-06-15: 3.375 g via INTRAVENOUS
  Filled 2021-06-15: qty 50

## 2021-06-15 MED ORDER — LACTATED RINGERS IV SOLN: INTRAVENOUS | Status: DC

## 2021-06-15 MED ORDER — CLINDAMYCIN PHOSPHATE 600 MG/50ML IV SOLN
600.0000 mg | Freq: Once | INTRAVENOUS | Status: AC
  Administered 2021-06-15: 600 mg via INTRAVENOUS
  Filled 2021-06-15: qty 50

## 2021-06-15 MED ORDER — DIPHENHYDRAMINE HCL 50 MG/ML IJ SOLN
25.0000 mg | Freq: Once | INTRAMUSCULAR | Status: AC
  Administered 2021-06-15: 25 mg via INTRAVENOUS
  Filled 2021-06-15: qty 1

## 2021-06-15 MED ORDER — VANCOMYCIN HCL 1250 MG/250ML IV SOLN
1250.0000 mg | Freq: Two times a day (BID) | INTRAVENOUS | Status: DC
  Filled 2021-06-15: qty 250

## 2021-06-15 MED ORDER — HYDROMORPHONE HCL 1 MG/ML IJ SOLN
1.0000 mg | Freq: Once | INTRAMUSCULAR | Status: AC
  Administered 2021-06-15: 1 mg via INTRAVENOUS
  Filled 2021-06-15: qty 1

## 2021-06-15 MED ORDER — LACTATED RINGERS IV BOLUS (SEPSIS)
1000.0000 mL | Freq: Once | INTRAVENOUS | Status: AC
  Administered 2021-06-15: 1000 mL via INTRAVENOUS

## 2021-06-15 MED ORDER — IOHEXOL 300 MG/ML  SOLN
100.0000 mL | Freq: Once | INTRAMUSCULAR | Status: AC | PRN
  Administered 2021-06-15: 100 mL via INTRAVENOUS

## 2021-06-15 MED ORDER — LACTATED RINGERS IV BOLUS (SEPSIS)
500.0000 mL | Freq: Once | INTRAVENOUS | Status: AC
  Administered 2021-06-15: 500 mL via INTRAVENOUS

## 2021-06-15 NOTE — ED Notes (Signed)
Pt. Leaving with [facility] to ED to [facility]. Pt. Stable and ready for transport. Transport loading pt. Now.

## 2021-06-15 NOTE — ED Provider Notes (Addendum)
MEDCENTER [facility] EMERGENCY DEPT Provider Note   CSN: [id number] Arrival date & time: 06/15/21  1019     History  Chief Complaint  Patient presents with   Abscess    Cody Marks is a 46 y.o. male.   Abscess  46 year old male with a history of recent drainage of a perineal abscess by Dr. 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  The patient was sent by his PCP to the emergency department.  He has had roughly 5 days of symptoms.  He was found to be tachycardic with subjective fevers and had low blood pressures at his PCP office to 98/68.  He has been taking Augmentin outpatient and had an I&D yesterday.  Since then he has had worsening pain, rapidly progressing swelling and redness up from his perineum throughout his scrotum.  He has a history of type 2 diabetes and is on Ozempic.   Home Medications Prior to Admission medications   Medication Sig Start Date End Date Taking? Authorizing Provider  acetaminophen (TYLENOL) 325 MG tablet Take 650 mg by mouth every 6 (six) hours as needed.   Yes [provider]  amoxicillin-clavulanate (AUGMENTIN) 875-125 MG tablet Take by mouth. 06/13/21 06/20/21 Yes [provider]  atorvastatin (LIPITOR) 40 MG tablet Take 1 tablet (40 mg total) by mouth daily. 04/20/20  Yes Ardith Dark, MD  Semaglutide,0.25 or 0.5MG /DOS, (OZEMPIC, 0.25 OR 0.5 MG/DOSE,) 2 MG/1.5ML SOPN Inject 0.25-0.5 mg into the skin once a week. 05/07/21  Yes Ardith Dark, MD  tadalafil (CIALIS) 10 MG tablet Take 0.5-1 tablets (5-10 mg total) by mouth every other day as needed for erectile dysfunction. 01/01/21   Ardith Dark, MD      Allergies    Patient has no known allergies.    Review of Systems   Review of Systems  Unable to perform ROS: Acuity of condition   Physical Exam Updated Vital Signs BP (!) 159/77    Pulse (!) 103    Temp 98.8 F  (37.1 C)    Resp (!) 24    SpO2 97%  Physical Exam Vitals and nursing note reviewed. Exam conducted with a chaperone present.  Constitutional:      General: He is not in acute distress.    Comments: Uncomfortable appearing  HENT:     Head: Normocephalic and atraumatic.  Eyes:     Conjunctiva/sclera: Conjunctivae normal.     Pupils: Pupils are equal, round, and reactive to light.  Cardiovascular:     Rate and Rhythm: Regular rhythm. Tachycardia present.     Pulses: Normal pulses.  Pulmonary:     Effort: Pulmonary effort is normal. No respiratory distress.  Abdominal:     General: There is no distension.     Tenderness: There is no abdominal tenderness. There is no guarding.  Genitourinary:    Comments: Erythematous and darkened scrotum with significant distention and tenderness to palpation, tenderness of the perineum with some purulent discharge noted status post I&D Musculoskeletal:        General: No deformity or signs of injury.     Cervical back: Neck supple.  Skin:    Findings: No lesion or rash.  Neurological:     General: No focal deficit present.     Mental Status: He is alert. Mental status is at baseline.    ED Results / Procedures / Treatments   Labs (all labs ordered are  listed, but only abnormal results are displayed) Labs Reviewed  COMPREHENSIVE METABOLIC PANEL - Abnormal; Notable for the following components:      Result Value   Sodium 133 (*)    Glucose, Bld 272 (*)    All other components within normal limits  CBC WITH DIFFERENTIAL/PLATELET - Abnormal; Notable for the following components:   WBC 12.3 (*)    Hemoglobin 12.0 (*)    HCT 37.2 (*)    Neutro Abs 8.9 (*)    All other components within normal limits  URINALYSIS, ROUTINE W REFLEX MICROSCOPIC - Abnormal; Notable for the following components:   Color, Urine COLORLESS (*)    Specific Gravity, Urine 1.032 (*)    Glucose, UA >1,000 (*)    All other components within normal limits  RESP PANEL BY  RT-PCR (FLU A&B, COVID) ARPGX2  CULTURE, BLOOD (ROUTINE X 2)  CULTURE, BLOOD (ROUTINE X 2)  LACTIC ACID, PLASMA  PROTIME-INR    EKG None  Radiology CT ABDOMEN PELVIS W CONTRAST  Result Date: 06/15/2021 CLINICAL DATA:  Perineal abscess, area was lanced yesterday, worsened pain and increased scrotal swelling, diabetes mellitus, question Fournier's gangrene EXAM: CT ABDOMEN AND PELVIS WITH CONTRAST TECHNIQUE: Multidetector CT imaging of the abdomen and pelvis was performed using the standard protocol following bolus administration of intravenous contrast. RADIATION DOSE REDUCTION: This exam was performed according to the departmental dose-optimization program which includes automated exposure control, adjustment of the mA and/or kV according to patient size and/or use of iterative reconstruction technique. CONTRAST:  OMNIPAQUE IOHEXOL 300 MG/ML SOLN IV. No oral contrast. COMPARISON:  None FINDINGS: Lower chest: Lung bases clear Hepatobiliary: Gallbladder and liver normal appearance Pancreas: Normal appearance Spleen: Normal appearance Adrenals/Urinary Tract: Adrenal glands, kidneys, ureters, and bladder normal appearance Stomach/Bowel: Normal appendix. Stomach and bowel loops normal appearance Vascular/Lymphatic: Vascular structures patent. Aorta normal caliber. No adenopathy. Reproductive: Unremarkable prostate gland and seminal vesicles Other: Umbilical hernia containing fat. No free air or free fluid. Diffuse infiltrative changes in the perineum greater on LEFT consistent with cellulitis. Edema extends into the scrotum. No extension to the perianal or perirectal regions. Scattered edema within the soft tissues in the LEFT perineum without discrete drainable collection. Tiny LEFT inguinal hernia containing fat. No abnormal foci of soft tissue gas identified. Musculoskeletal: BILATERAL spondylolysis L5 with minimal spondylolisthesis. IMPRESSION: Diffuse infiltrative changes in the perineum greater  on LEFT consistent with cellulitis, extending into scrotum. Scattered edema within the soft tissues in the LEFT perineum without discrete drainable collection. No abnormal soft tissue gas identified to suggest necrotizing fasciitis/Fournier's gangrene. Tiny LEFT inguinal hernia containing fat. Umbilical hernia containing fat. BILATERAL spondylolysis L5 with minimal spondylolisthesis. Electronically Signed   By: Ulyses Southward M.D.   On: 06/15/2021 12:14    Procedures .Critical Care Performed by: Ernie Avena, MD Authorized by: Ernie Avena, MD   Critical care provider statement:    Critical care time (minutes):  74   Critical care was necessary to treat or prevent imminent or life-threatening deterioration of the following conditions:  Sepsis   Critical care was time spent personally by me on the following activities:  Blood draw for specimens, development of treatment plan with patient or surrogate, discussions with consultants, ordering and performing treatments and interventions, ordering and review of laboratory studies, ordering and review of radiographic studies, pulse oximetry, re-evaluation of patient's condition, obtaining history from patient or surrogate and evaluation of patient's response to treatment   Care discussed with: accepting provider at another facility  Medications Ordered in ED Medications  lactated ringers infusion ( Intravenous New Bag/Given 06/15/21 1401)  vancomycin (VANCOREADY) IVPB 1250 mg/250 mL (has no administration in time range)  lactated ringers bolus 1,000 mL (0 mLs Intravenous Stopped 06/15/21 1402)    And  lactated ringers bolus 1,000 mL (0 mLs Intravenous Stopped 06/15/21 1421)    And  lactated ringers bolus 500 mL (500 mLs Intravenous New Bag/Given 06/15/21 1420)  vancomycin (VANCOCIN) IVPB 1000 mg/200 mL premix (0 mg Intravenous Stopped 06/15/21 1403)  piperacillin-tazobactam (ZOSYN) IVPB 3.375 g (0 g Intravenous Stopped 06/15/21 1403)  clindamycin  (CLEOCIN) IVPB 600 mg (0 mg Intravenous Stopped 06/15/21 1403)  vancomycin (VANCOCIN) IVPB 1000 mg/200 mL premix (1,000 mg Intravenous New Bag/Given 06/15/21 1332)  iohexol (OMNIPAQUE) 300 MG/ML solution 100 mL (100 mLs Intravenous Contrast Given 06/15/21 1200)  HYDROmorphone (DILAUDID) injection 1 mg (1 mg Intravenous Given 06/15/21 1324)  diphenhydrAMINE (BENADRYL) injection 25 mg (25 mg Intravenous Given 06/15/21 1334)  HYDROmorphone (DILAUDID) injection 1 mg (1 mg Intravenous Given 06/15/21 1524)    ED Course/ Medical Decision Making/ A&P Clinical Course as of 06/15/21 1620  Tue Jun 15, 2021  1148 WBC(!): 12.3 [JL]  1148 Pulse Rate(!): 103 [JL]    Clinical Course User Index [JL] Ernie Avena, MD                           Medical Decision Making Amount and/or Complexity of Data Reviewed Labs: ordered. Decision-making details documented in ED Course. Radiology: ordered.  Risk Prescription drug management.   46 year old male with a history of recent drainage of a perineal abscess by Dr. 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  The patient was sent by his PCP to the emergency department.  He has had roughly 5 days of symptoms.  He was found to be tachycardic with subjective fevers and had low blood pressures at his PCP office to 98/68.  He has been taking Augmentin outpatient and had an I&D yesterday.  Since then he has had worsening pain, rapidly progressing swelling and redness up from his perineum throughout his scrotum.  He has a history of type 2 diabetes and is on Ozempic.   On arrival, the patient was afebrile, mildly tachycardic P103, hemodynamically stable, BP 133/80.  On exam, the patient had concerning findings for Fournier's gangrene with erythematous and darkened scrotum, significant tenderness palpation, tense scrotum noted bilaterally, purulent drainage noted from the perineum  status post I&D.  No clear crepitus present although patient with significant pain on attempts at palpation makes examination difficult.  Work-up initial did to include CBC with a leukocytosis to 12.3, CMP with a mild hyponatremia to 133, hyperglycemia to 272, lactic acid normal 0.9, urinalysis without evidence of UTI, COVID-19 and influenza PCR testing negative.  Code sepsis was initiated on patient arrival and the patient was started on broad-spectrum antibiotics to include vancomycin, Zosyn, clindamycin IV.  He was administered Dilaudid for pain control in addition to 30 cc/kg of IV fluids.  Concern clinically for developing Fournier's gangrene status post I&D yesterday.   On repeat evaluation, the patient appeared clinically stable status post fluid resuscitation.  He completed a course of IV vancomycin and IV Zosyn in the emergency department.  IV clindamycin was started but stopped subsequently after the patient developed a facial rash immediately upon initiation of the medication.  He was administered IV Benadryl  for this with subsequent resolution.  CT abdomen pelvis was performed which revealed diffuse evidence of perineal and scrotal cellulitis with no clear soft tissue gas.  Despite this, I am concerned clinically for Fournier's gangrene given the rapid progression of his infection and diffuse pain throughout the perineum and groin and scrotum.  I did speak with on-call urology through [facility] who recommended that the patient be evaluated by surgery at [facility] as his I&D was done through there.  I did speak with the [facility] physician access line and discussed the case with Dr. Harlow AsaMallery of [facility] general surgery who accepts the patient in transfer to the ED for evaluation for possible developing Fournier's gangrene.  The patient would benefit from urgent urologic consultation.  EMTALA completed and air care arrived from [facility] to transport the patient emergently to [facility] for further care and specialty consultation.  EMTALA completed prior to transfer.   Final Clinical Impression(s) / ED Diagnoses Final diagnoses:  Fournier gangrene  Sepsis, due to unspecified organism, unspecified whether acute organ dysfunction present [facility](HCC)    Rx / DC Orders ED Discharge Orders     None         Ernie AvenaLawsing, Jaythen Hamme, MD 06/15/21 1620    Ernie AvenaLawsing, Nashonda Limberg, MD 06/15/21 1620

## 2021-06-15 NOTE — Progress Notes (Signed)
Elink following code sepsis °

## 2021-06-15 NOTE — Progress Notes (Signed)
° °  Cody Marks is a 46 y.o. male who presents today for an office visit.  Assessment/Plan:  New/Acute Problems: Perineal Abscess Concern for possible Fournier's Gangrene.  He is having some signs of systemic illness including tachycardia and subjective fevers.  Blood pressures are also on the low side at 98/68.  He needs stat imaging.  We attempted obtain this outpatient however his insurance required a prior authorization.  In light of best patient care, patient was directed to go to the emergency room for stat imaging and possible IV antibiotics.  He and his wife voiced understanding.  Chronic Problems Addressed Today: Type 2 diabetes mellitus with hyperglycemia (HCC) Recently started Ozempic.  His A1c has been uncontrolled at 9.5.  This increases the risk for complicated infection.  Discussed with patient Ozempic is not likely because of perianal abscess or Fournier's Gangrene.      Subjective:  HPI:  Patient here with concern for perineal abscess.  Symptoms started 5 days ago.  Went to urgent care 2 days ago and was started on Augmentin.  He followed up with surgery the next day and had incision and drainage.  He has had fevers and chills.  Significant amount of pain to the area. Pain is worsening since yesterday. No missed dose of antibiotics. He is concerned it may be due to ozempic which he just started a month ago.        Objective:  Physical Exam: BP 98/68 (Patient Position: Standing)    Pulse (!) 118    Temp 97.7 F (36.5 C) (Temporal)    Ht 5\' 5"  (1.651 m)    Wt 233 lb 3.2 oz (105.8 kg)    SpO2 96%    BMI 38.81 kg/m   Gen: No acute distress, resting comfortably GU: Approximately 2 to 3 cm abscess on perineal area.  Purulent and bloody discharge present.  Very tender to palpation.  Tracking into posterior scrotum. Neuro: Grossly normal, moves all extremities Psych: Normal affect and thought content      Cody Rhine M. , MD 06/15/2021 10:05 AM

## 2021-06-15 NOTE — Assessment & Plan Note (Signed)
Recently started Ozempic.  His A1c has been uncontrolled at 9.5.  This increases the risk for complicated infection.  Discussed with patient Ozempic is not likely because of perianal abscess or Fournier's Gangrene.

## 2021-06-15 NOTE — ED Triage Notes (Addendum)
Perineal abscess started on Thursday and was seen at [facility] yesterday.  Area was lanced yesterday, although pt states the pain is much worse and increased scrotal swelling.  Sent here for CT of pelvis but was not scheduled   Pt is currently taking augmentin

## 2021-06-15 NOTE — Progress Notes (Signed)
Pharmacy Antibiotic Note  Cody Marks is a 46 y.o. male admitted on 06/15/2021 with sepsis and cellulitis.  Pharmacy has been consulted for vancomycin dosing. Pt is afebrile but WBC is elevated at 12.3. SCr and lactic acid are WNL.   Plan: Vancomycin 2gm IV x 1 then 1250mg  IV Q12H  F/u renal fxn, C&S, clinical status and peak/trough at [facility] F/u ongoing gram neg coverage    Temp (24hrs), Avg:97.8 F (36.6 C), Min:97.7 F (36.5 C), Max:97.8 F (36.6 C)  Recent Labs  Lab 06/15/21 1049  WBC 12.3*  CREATININE 0.77  LATICACIDVEN 0.9    Estimated Creatinine Clearance: 133.4 mL/min (by C-G formula based on SCr of 0.77 mg/dL).    No Known Allergies  Antimicrobials this admission: Vanc 2/21>> Clinda x 1 2/21 Zosyn x 1 2/21  Dose adjustments this admission: N/A  Microbiology results: Pending  Thank you for allowing pharmacy to be a part of this patients care.  Anjannette Gauger, 3/21 06/15/2021 11:51 AM

## 2021-06-15 NOTE — Patient Instructions (Signed)
It was very nice to see you today!  I am concerned that you have a serious infection.  Please go straight to the emergency department at the med center on [address].   Take care, Dr Jerline Pain

## 2021-06-16 DIAGNOSIS — L02215 Cutaneous abscess of perineum: Secondary | ICD-10-CM | POA: Insufficient documentation

## 2021-06-18 ENCOUNTER — Other Ambulatory Visit: Payer: Self-pay

## 2021-06-18 ENCOUNTER — Encounter: Payer: Self-pay | Admitting: Family Medicine

## 2021-06-18 ENCOUNTER — Ambulatory Visit (INDEPENDENT_AMBULATORY_CARE_PROVIDER_SITE_OTHER): Payer: BC Managed Care – PPO | Admitting: Family Medicine

## 2021-06-18 VITALS — BP 138/85 | HR 92 | Temp 97.2°F | Ht 65.0 in | Wt 231.6 lb

## 2021-06-18 DIAGNOSIS — L0390 Cellulitis, unspecified: Secondary | ICD-10-CM | POA: Diagnosis not present

## 2021-06-18 DIAGNOSIS — E1165 Type 2 diabetes mellitus with hyperglycemia: Secondary | ICD-10-CM

## 2021-06-18 MED ORDER — JANUMET 50-1000 MG PO TABS: 1.0000 | ORAL_TABLET | Freq: Two times a day (BID) | ORAL | 3 refills | Status: DC

## 2021-06-18 NOTE — Assessment & Plan Note (Signed)
Patient would like to stop Ozempic.  He is concerned this may have been contributing to his cellulitis.  Discussed with patient this is not typically seen though he would be understandable and reasonable to switch off.  We will start Janumet.  He has done well with this in the past.  He will follow-up with me about 3 months to recheck A1c.

## 2021-06-18 NOTE — Patient Instructions (Signed)
It was very nice to see you today!  Please stop the Ozempic.  Please start the Janumet tomorrow.  Let me know if you need a refill on your pain medications or if you need for [place] to complete any forms for your work.  I will see back in 3 months.  Please come back to see me sooner if needed.  Take care, Dr Jimmey Ralph  PLEASE NOTE:  If you had any lab tests please let us know if you have not heard back within a few days. You may see your results on mychart before we have a chance to review them but we will give you a call once they are reviewed by [place]. If we ordered any referrals today, please let us know if you have not heard from their office within the next week.   Please try these tips to maintain a healthy lifestyle:  Eat at least 3 REAL meals and 1-2 snacks per day.  Aim for no more than 5 hours between eating.  If you eat breakfast, please do so within one hour of getting up.   Each meal should contain half fruits/vegetables, one quarter protein, and one quarter carbs (no bigger than a computer mouse)  Cut down on sweet beverages. This includes juice, soda, and sweet tea.   Drink at least 1 glass of water with each meal and aim for at least 8 glasses per day  Exercise at least 150 minutes every week.

## 2021-06-18 NOTE — Progress Notes (Signed)
° °  Cody Marks is a 46 y.o. male who presents today for an office visit.  Assessment/Plan:  New/Acute Problems: Perineal cellulitis Seems to be improving status post I&D a couple of days ago.  He is no longer on antibiotics.  He has been using Norco for pain.  He will continue management per surgery.  With following up with them next week.  We discussed reasons to return to care.  We will complete short-term disability as needed.  Chronic Problems Addressed Today: Type 2 diabetes mellitus with hyperglycemia (HCC) Patient would like to stop Ozempic.  He is concerned this may have been contributing to his cellulitis.  Discussed with patient this is not typically seen though he would be understandable and reasonable to switch off.  We will start Janumet.  He has done well with this in the past.  He will follow-up with me about 3 months to recheck A1c.     Subjective:  HPI:  Patient is here for hospital follow-up.  We saw him in our clinic 3 days ago.  There was concern for possible foreign years gangrene.  We directed him to go directly to the emergency department for further imaging.  On arrival to the ED he was started on broad-spectrum antibiotics, IV fluids.  CT scan showed cellulitis but no signs of abscess or foreign years gangrene.  There was clinical concern for further progression and he was transferred to [facility] for further management.  While there he underwent irrigation and debridement under anesthesia.  Drain was placed.  He did well postoperatively and was discharged home.   Since being home, things have been improving slowly. Pain is slightly improving. He is still having a lot of drainage from his wound and change his dressing 5-6 times per day.  No fevers or chills.  Uses Norco as needed which works well.  He will be following up with the surgeon next week.       Objective:  Physical Exam: BP 138/85 (BP Location: Left Arm)    Pulse 92    Temp (!) 97.2 F  (36.2 C) (Temporal)    Ht 5\' 5"  (1.651 m)    Wt 231 lb 9.6 oz (105.1 kg)    SpO2 97%    BMI 38.54 kg/m   Gen: No acute distress, resting comfortably GU: Approximately 15 cm incision on left inguinal crease and scrotum.  Drain in place.  Sanguinous drainage present. Neuro: Grossly normal, moves all extremities Psych: Normal affect and thought content  Time Spent: 45 minutes of total time was spent on the date of the encounter performing the following actions: chart review prior to seeing the patient including recent hospitalization, obtaining history, performing a medically necessary exam, counseling on the treatment plan, placing orders, and documenting in our EHR.       . Cody Degree, MD 06/18/2021 10:07 AM

## 2021-06-20 LAB — CULTURE, BLOOD (ROUTINE X 2)
Culture: NO GROWTH
Culture: NO GROWTH
Special Requests: ADEQUATE
Special Requests: ADEQUATE

## 2021-06-21 ENCOUNTER — Other Ambulatory Visit: Payer: Self-pay | Admitting: Family Medicine

## 2021-06-21 MED ORDER — ATORVASTATIN CALCIUM 40 MG PO TABS: 40.0000 mg | ORAL_TABLET | Freq: Every day | ORAL | 3 refills | Status: DC

## 2021-06-24 ENCOUNTER — Telehealth: Payer: Self-pay

## 2021-06-24 NOTE — Telephone Encounter (Signed)
Office has received FMLA forms to complete for patient.  Patient states at his last OV with Dr. Jimmey Ralph, Dr. Jimmey Ralph stated he would complete FMLA forms.  Patient states that his urologist/ surgical team was unable to take a drain out.  States they have extended his leave until 3/16.    Is requesting forms to state the same.  Please follow up with patient in regard.

## 2021-06-25 NOTE — Telephone Encounter (Signed)
Received FMLA paperwork, will contact patient when completed. ?

## 2021-06-25 NOTE — Telephone Encounter (Signed)
FMLA need to be done by surgeon ?Patient aware ?FMLA Form faxed to urologist/surgical  ?[phone] ?  ? ?

## 2021-07-01 ENCOUNTER — Telehealth: Payer: Self-pay | Admitting: Family Medicine

## 2021-07-01 ENCOUNTER — Other Ambulatory Visit: Payer: Self-pay | Admitting: *Deleted

## 2021-07-01 MED ORDER — METFORMIN HCL 1000 MG PO TABS: 1000.0000 mg | ORAL_TABLET | Freq: Two times a day (BID) | ORAL | 3 refills | Status: DC

## 2021-07-01 NOTE — Telephone Encounter (Signed)
Rx send to pharmacy  

## 2021-07-01 NOTE — Telephone Encounter (Signed)
Ok to send in metformin 1000mg  twice daily. ? ? . Katina Degree, MD ?07/01/2021 3:44 PM  ? ?

## 2021-07-01 NOTE — Telephone Encounter (Signed)
Please advise 

## 2021-07-01 NOTE — Telephone Encounter (Signed)
Pt states he was not able to pay for his original prescription of JANUMET. He received a free monthly sample of JANUVIA. He is asking if Jimmey Ralph can order METFORMIN for him.  ? ?PHARMACY: ?[facility] [address] [place] - [address] Phone:  [phone]  ?Fax:  [phone]  ?  ? ?

## 2021-07-06 ENCOUNTER — Telehealth: Payer: Self-pay | Admitting: Family Medicine

## 2021-07-06 NOTE — Telephone Encounter (Signed)
..  Type of form received:FMLA ? ?Additional comments:  ? ?Received by:FAX ? ?Form should be Faxed [phone] ? ?Form should be mailed to:   ? ?Is patient requesting call for pickup: ? ? ?Form placed:  in [place] folder ? ?Attach charge sheet.  ? ?Individual made aware of 3-5 business day turn around (Y/N)? ?  ?

## 2021-07-08 NOTE — Telephone Encounter (Signed)
Forms received to be completed, advised please allow 7-10 business days to complete forms per office policy.  ?

## 2021-07-09 NOTE — Telephone Encounter (Signed)
Spoke with patient, need to call Surgeon for clearance on FMLA  ?Call transfer to front office for appointment for TB clearance with PCP  ?  ?

## 2021-07-12 ENCOUNTER — Ambulatory Visit (INDEPENDENT_AMBULATORY_CARE_PROVIDER_SITE_OTHER)
Admission: RE | Admit: 2021-07-12 | Discharge: 2021-07-12 | Disposition: A | Discharge status: Home / Self Care | Payer: BC Managed Care – PPO | Source: Ambulatory Visit | Attending: Family Medicine | Admitting: Family Medicine

## 2021-07-12 ENCOUNTER — Ambulatory Visit (INDEPENDENT_AMBULATORY_CARE_PROVIDER_SITE_OTHER): Payer: BC Managed Care – PPO | Admitting: Family Medicine

## 2021-07-12 ENCOUNTER — Encounter: Payer: Self-pay | Admitting: Family Medicine

## 2021-07-12 ENCOUNTER — Other Ambulatory Visit: Payer: Self-pay

## 2021-07-12 VITALS — BP 119/82 | HR 106 | Temp 98.0°F | Ht 65.0 in | Wt 226.6 lb

## 2021-07-12 DIAGNOSIS — R1032 Left lower quadrant pain: Secondary | ICD-10-CM | POA: Diagnosis not present

## 2021-07-12 DIAGNOSIS — Z111 Encounter for screening for respiratory tuberculosis: Secondary | ICD-10-CM

## 2021-07-12 DIAGNOSIS — Z23 Encounter for immunization: Secondary | ICD-10-CM

## 2021-07-12 DIAGNOSIS — E1165 Type 2 diabetes mellitus with hyperglycemia: Secondary | ICD-10-CM | POA: Diagnosis not present

## 2021-07-12 MED ORDER — JANUMET XR 50-1000 MG PO TB24: 1.0000 | ORAL_TABLET | Freq: Two times a day (BID) | ORAL | 3 refills | Status: DC

## 2021-07-12 NOTE — Assessment & Plan Note (Addendum)
Reported blood sugars are at goal on Janumet 50-1000 twice daily.  Though he is having some diarrhea. We will be switching to external list to see if this helps some with the diarrhea.  Follow-up in about a month to recheck A1c. ?

## 2021-07-12 NOTE — Progress Notes (Signed)
? ?  Cody Marks is a 46 y.o. male who presents today for an office visit. ? ?Assessment/Plan:  ?New/Acute Problems: ?Left Groin Pain ?Healthy granulation tissue present on exam today.  No signs of infection or purulence.  Anticipate this will fully heal over the next few weeks.  We discussed reasons to return to care. ? ?Diarrhea ?Potentially related to recent courses of antibiotics.  He is also on metformin which could be playing a role.  We will be switching to his to release his blood.  He will continue his probiotic. ? ?Screening for TB ?We will check chest x-ray. ? ?Chronic Problems Addressed Today: ?Type 2 diabetes mellitus with hyperglycemia ([place]) ?Reported blood sugars are at goal on Janumet 50-1000 twice daily.  Though he is having some diarrhea. We will be switching to external list to see if this helps some with the diarrhea.  Follow-up in about a month to recheck A1c. ? ?Tdap given today.  ? ?  ?Subjective:  ?HPI: ? ?Please see A/P for status of chronic conditions. ? ?Patient needs to have forms completed for work for TB screening.  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  Does not have any current symptoms of active TB. ? ?Is doing well status post I&D of area concerning for perianal abscess and cellulitis.  Still has a small amount of drainage and pain to the area but this seems to be improving. ? ?He is also had ongoing diarrhea for the last several weeks.  Tried taking probiotics without much improvement. ? ?   ?  ?Objective:  ?Physical Exam: ?BP 119/82 (BP Location: Left Arm)   Pulse (!) 106   Temp 98 ?F (36.7 ?C) (Temporal)   Ht 5\' 5"  (1.651 m)   Wt 226 lb 9.6 oz (102.8 kg)   SpO2 100%   BMI 37.71 kg/m?   ?Gen: No acute distress, resting comfortably ?CV: Regular rate and rhythm with no murmurs appreciated ?Pulm: Normal work of breathing, clear to auscultation bilaterally with no crackles, wheezes, or rhonchi ?GU: Small approximately 1.5 cm open area on left  posterior inguinal crease with healthy granulation tissue present.  No surrounding erythema.  No purulent discharge. ?Neuro: Grossly normal, moves all extremities ?Psych: Normal affect and thought content ? ?   ? ?Algis Greenhouse. Jerline Pain, MD ?07/12/2021 9:42 AM  ? ?

## 2021-07-12 NOTE — Progress Notes (Signed)
Please inform patient of the following: ? ?Good news! Xray shows no signs of TB. We can complete his form for work. ? ?Cody Marks. Jerline Pain, MD ?07/12/2021 12:34 PM  ?

## 2021-07-12 NOTE — Patient Instructions (Signed)
It was very nice to see you today! ? ?We will give your tetanus shot today. ? ?We will give you a letter to return to work. ? ?Please try the extended release of the Janumet once you finish your current supply to see if this helps with the diarrhea. ? ?I will see back in a month to recheck your A1c.  Come back to see me sooner if needed. ? ?Take care, ?Dr Jimmey Ralph ? ?PLEASE NOTE: ? ?If you had any lab tests please let us know if you have not heard back within a few days. You may see your results on mychart before we have a chance to review them but we will give you a call once they are reviewed by [place]. If we ordered any referrals today, please let us know if you have not heard from their office within the next week.  ? ?Please try these tips to maintain a healthy lifestyle: ? ?Eat at least 3 REAL meals and 1-2 snacks per day.  Aim for no more than 5 hours between eating.  If you eat breakfast, please do so within one hour of getting up.  ? ?Each meal should contain half fruits/vegetables, one quarter protein, and one quarter carbs (no bigger than a computer mouse) ? ?Cut down on sweet beverages. This includes juice, soda, and sweet tea.  ? ?Drink at least 1 glass of water with each meal and aim for at least 8 glasses per day ? ?Exercise at least 150 minutes every week.   ?

## 2021-08-05 ENCOUNTER — Ambulatory Visit: Payer: BC Managed Care – PPO | Admitting: Family Medicine

## 2021-08-06 ENCOUNTER — Ambulatory Visit: Payer: BC Managed Care – PPO | Admitting: Family Medicine

## 2021-09-06 NOTE — Telephone Encounter (Signed)
CLOSING NOTE. ?

## 2021-09-13 ENCOUNTER — Ambulatory Visit (INDEPENDENT_AMBULATORY_CARE_PROVIDER_SITE_OTHER): Payer: BC Managed Care – PPO | Admitting: Family Medicine

## 2021-09-13 ENCOUNTER — Encounter: Payer: Self-pay | Admitting: Family Medicine

## 2021-09-13 VITALS — BP 122/78 | HR 83 | Temp 97.6°F | Ht 65.0 in | Wt 223.6 lb

## 2021-09-13 DIAGNOSIS — E1165 Type 2 diabetes mellitus with hyperglycemia: Secondary | ICD-10-CM

## 2021-09-13 DIAGNOSIS — N529 Male erectile dysfunction, unspecified: Secondary | ICD-10-CM | POA: Insufficient documentation

## 2021-09-13 LAB — POCT GLYCOSYLATED HEMOGLOBIN (HGB A1C): Hemoglobin A1C: 6.2 % — AB (ref 4.0–5.6)

## 2021-09-13 MED ORDER — TADALAFIL 10 MG PO TABS: 5.0000 mg | ORAL_TABLET | ORAL | 1 refills | Status: DC | PRN

## 2021-09-13 NOTE — Assessment & Plan Note (Signed)
He has continued to lose weight is down about 8 pounds since her last visit.  He is doing a great job with diet and exercise.  We can recheck again in 6 months.

## 2021-09-13 NOTE — Patient Instructions (Addendum)
It was very nice to see you today!  Please call [facility] to schedule your vasectomy.   Your A1c looks great today.  No medication changes.  Please continue to work on diet and exercise.  I will see back in 6 months for your annual physical.  Please come back to see me sooner if needed.  Take care, Dr Jimmey Ralph  PLEASE NOTE:  If you had any lab tests please let us know if you have not heard back within a few days. You may see your results on mychart before we have a chance to review them but we will give you a call once they are reviewed by [place]. If we ordered any referrals today, please let us know if you have not heard from their office within the next week.   Please try these tips to maintain a healthy lifestyle:  Eat at least 3 REAL meals and 1-2 snacks per day.  Aim for no more than 5 hours between eating.  If you eat breakfast, please do so within one hour of getting up.   Each meal should contain half fruits/vegetables, one quarter protein, and one quarter carbs (no bigger than a computer mouse)  Cut down on sweet beverages. This includes juice, soda, and sweet tea.   Drink at least 1 glass of water with each meal and aim for at least 8 glasses per day  Exercise at least 150 minutes every week.

## 2021-09-13 NOTE — Progress Notes (Signed)
   Cody Marks is a 46 y.o. male who presents today for an office visit.  Assessment/Plan:  Chronic Problems Addressed Today: Erectile dysfunction Stable on Cialis 5 to 10 mg daily as needed.  Will refill today.  Type 2 diabetes mellitus with hyperglycemia (HCC) A1c much better today at 6.2.  We will continue Janumet 50-1000 twice daily.  Follow-up in 6 months for recheck A1c.  He is on a great job with diet and exercise and he will continue working on these things.  Morbid obesity ([place]) He has continued to lose weight is down about 8 pounds since her last visit.  He is doing a great job with diet and exercise.  We can recheck again in 6 months.  He will call to schedule appointment with urology for vasectomy.     Subjective:  HPI:  See A/p for status of chronic conditions.        Objective:  Physical Exam: BP 122/78   Pulse 83   Temp 97.6 F (36.4 C) (Temporal)   Ht 5\' 5"  (1.651 m)   Wt 223 lb 9.6 oz (101.4 kg)   SpO2 97%   BMI 37.21 kg/m   Wt Readings from Last 3 Encounters:  09/13/21 223 lb 9.6 oz (101.4 kg)  07/12/21 226 lb 9.6 oz (102.8 kg)  06/18/21 231 lb 9.6 oz (105.1 kg)    Gen: No acute distress, resting comfortably CV: Regular rate and rhythm with no murmurs appreciated Pulm: Normal work of breathing, clear to auscultation bilaterally with no crackles, wheezes, or rhonchi Neuro: Grossly normal, moves all extremities Psych: Normal affect and thought content      Cody Marks M. Jerline Pain, MD 09/13/2021 8:17 AM

## 2021-09-13 NOTE — Assessment & Plan Note (Signed)
Stable on Cialis 5 to 10 mg daily as needed.  Will refill today. 

## 2021-09-13 NOTE — Assessment & Plan Note (Signed)
A1c much better today at 6.2.  We will continue Janumet 50-1000 twice daily.  Follow-up in 6 months for recheck A1c.  He is on a great job with diet and exercise and he will continue working on these things.

## 2022-01-17 ENCOUNTER — Encounter: Payer: Self-pay | Admitting: *Deleted

## 2022-03-23 ENCOUNTER — Ambulatory Visit (INDEPENDENT_AMBULATORY_CARE_PROVIDER_SITE_OTHER): Payer: BC Managed Care – PPO | Admitting: Family Medicine

## 2022-03-23 ENCOUNTER — Encounter: Payer: Self-pay | Admitting: Family Medicine

## 2022-03-23 VITALS — BP 120/70 | HR 92 | Temp 97.8°F | Ht 65.0 in | Wt 228.8 lb

## 2022-03-23 DIAGNOSIS — N529 Male erectile dysfunction, unspecified: Secondary | ICD-10-CM | POA: Diagnosis not present

## 2022-03-23 DIAGNOSIS — Z23 Encounter for immunization: Secondary | ICD-10-CM

## 2022-03-23 DIAGNOSIS — E1165 Type 2 diabetes mellitus with hyperglycemia: Secondary | ICD-10-CM | POA: Diagnosis not present

## 2022-03-23 DIAGNOSIS — E785 Hyperlipidemia, unspecified: Secondary | ICD-10-CM | POA: Diagnosis not present

## 2022-03-23 LAB — POCT GLYCOSYLATED HEMOGLOBIN (HGB A1C): Hemoglobin A1C: 6.4 % — AB (ref 4.0–5.6)

## 2022-03-23 MED ORDER — TADALAFIL 10 MG PO TABS: 5.0000 mg | ORAL_TABLET | ORAL | 1 refills | Status: DC | PRN

## 2022-03-23 NOTE — Assessment & Plan Note (Signed)
BMI 38 today.  Discussed lifestyle modifications.  Will consider GLP-1 agonist at some point in the future to help with this however we had him on Ozempic for this previously and ended up developing perineal abscess.

## 2022-03-23 NOTE — Assessment & Plan Note (Signed)
A1c well controlled 6.4.  He is doing well with medication.  We will continue his Janumet 50-1000 twice daily.  We discussed lifestyle modifications.  He is working on cutting down on carbs and trying to get more cardio exercise.  We will recheck again in 6 months at his CPE.

## 2022-03-23 NOTE — Progress Notes (Signed)
   Cody Marks is a 46 y.o. male who presents today for an office visit.  Assessment/Plan:  Chronic Problems Addressed Today: Type 2 diabetes mellitus with hyperglycemia (HCC) A1c well controlled 6.4.  He is doing well with medication.  We will continue his Janumet 50-1000 twice daily.  We discussed lifestyle modifications.  He is working on cutting down on carbs and trying to get more cardio exercise.  We will recheck again in 6 months at his CPE.  Morbid obesity (HCC) BMI 38 today.  Discussed lifestyle modifications.  Will consider GLP-1 agonist at some point in the future to help with this however we had him on Ozempic for this previously and ended up developing perineal abscess.  Dyslipidemia On Lipitor 40 mg daily.  Tolerating well.  No side effects.  Check lipids when he comes back in 6 months for CPE.  Erectile dysfunction Stable on Cialis 5 to 10 mg daily as needed.  Will refill today.     Subjective:  HPI:  See A/p for status of chronic conditions.  He is doing well.        Objective:  Physical Exam: BP 120/70   Pulse 92   Temp 97.8 F (36.6 C) (Temporal)   Ht 5\' 5"  (1.651 m)   Wt 228 lb 12.8 oz (103.8 kg)   SpO2 97%   BMI 38.07 kg/m   Wt Readings from Last 3 Encounters:  03/23/22 228 lb 12.8 oz (103.8 kg)  09/13/21 223 lb 9.6 oz (101.4 kg)  07/12/21 226 lb 9.6 oz (102.8 kg)  Gen: No acute distress, resting comfortably CV: Regular rate and rhythm with no murmurs appreciated Pulm: Normal work of breathing, clear to auscultation bilaterally with no crackles, wheezes, or rhonchi Neuro: Grossly normal, moves all extremities Psych: Normal affect and thought content      Braydee Shimkus M. 07/14/21, MD 03/23/2022 8:34 AM

## 2022-03-23 NOTE — Assessment & Plan Note (Signed)
On Lipitor 40 mg daily.  Tolerating well.  No side effects.  Check lipids when he comes back in 6 months for CPE.

## 2022-03-23 NOTE — Assessment & Plan Note (Signed)
Stable on Cialis 5 to 10 mg daily as needed.  Will refill today.

## 2022-03-23 NOTE — Patient Instructions (Signed)
It was very nice to see you today!  I am glad that you are doing well!  Your A1c looks great today.  No medication changes today.  Please come back in 6 months for your annual checkup.  We will check labs at this visit.  Please come back to see me sooner if needed.  Take care, Dr Jimmey Ralph  PLEASE NOTE:  If you had any lab tests please let us know if you have not heard back within a few days. You may see your results on mychart before we have a chance to review them but we will give you a call once they are reviewed by [place]. If we ordered any referrals today, please let us know if you have not heard from their office within the next week.   Please try these tips to maintain a healthy lifestyle:  Eat at least 3 REAL meals and 1-2 snacks per day.  Aim for no more than 5 hours between eating.  If you eat breakfast, please do so within one hour of getting up.   Each meal should contain half fruits/vegetables, one quarter protein, and one quarter carbs (no bigger than a computer mouse)  Cut down on sweet beverages. This includes juice, soda, and sweet tea.   Drink at least 1 glass of water with each meal and aim for at least 8 glasses per day  Exercise at least 150 minutes every week.

## 2022-03-30 ENCOUNTER — Telehealth: Payer: Self-pay

## 2022-03-30 ENCOUNTER — Telehealth: Payer: BC Managed Care – PPO | Admitting: Physician Assistant

## 2022-03-30 DIAGNOSIS — U071 COVID-19: Secondary | ICD-10-CM

## 2022-03-30 MED ORDER — FLUTICASONE PROPIONATE 50 MCG/ACT NA SUSP: 2.0000 | Freq: Every day | NASAL | 0 refills | Status: DC

## 2022-03-30 MED ORDER — NIRMATRELVIR/RITONAVIR (PAXLOVID)TABLET: 3.0000 | ORAL_TABLET | Freq: Two times a day (BID) | ORAL | 0 refills | Status: AC

## 2022-03-30 NOTE — Progress Notes (Signed)
Virtual Visit Consent   Cody Marks, you are scheduled for a virtual visit with a [facility] provider today. Just as with appointments in the office, your consent must be obtained to participate. Your consent will be active for this visit and any virtual visit you may have with one of our providers in the next 365 days. If you have a MyChart account, a copy of this consent can be sent to you electronically.  As this is a virtual visit, video technology does not allow for your provider to perform a traditional examination. This may limit your provider's ability to fully assess your condition. If your provider identifies any concerns that need to be evaluated in person or the need to arrange testing (such as labs, EKG, etc.), we will make arrangements to do so. Although advances in technology are sophisticated, we cannot ensure that it will always work on either your end or our end. If the connection with a video visit is poor, the visit may have to be switched to a telephone visit. With either a video or telephone visit, we are not always able to ensure that we have a secure connection.  By engaging in this virtual visit, you consent to the provision of healthcare and authorize for your insurance to be billed (if applicable) for the services provided during this visit. Depending on your insurance coverage, you may receive a charge related to this service.  I need to obtain your verbal consent now. Are you willing to proceed with your visit today? Cody Marks has provided verbal consent on 03/30/2022 for a virtual visit (video or telephone). Margaretann Loveless, PA-C  Date: 03/30/2022 4:29 PM  Virtual Visit via Video Note   I, Margaretann Loveless, connected with  Cody Marks  ([id number], 06/17/1975) on 03/30/22 at  4:15 PM EST by a video-enabled telemedicine application and verified that I am speaking with the correct person using two identifiers.  Location: Patient: Virtual Visit  Location Patient: Home Provider: Virtual Visit Location Provider: Home Office   I discussed the limitations of evaluation and management by telemedicine and the availability of in person appointments. The patient expressed understanding and agreed to proceed.    History of Present Illness: Cody Marks is a 46 y.o. who identifies as a male who was assigned male at birth, and is being seen today for Covid 43.  HPI: URI  This is a new problem. The current episode started in the past 7 days (tested positive for Covid 19 today, symptoms started Monday morning). The problem has been gradually worsening. The maximum temperature recorded prior to his arrival was 100.4 - 100.9 F. The fever has been present for Less than 1 day. Associated symptoms include congestion, coughing, diarrhea, headaches, rhinorrhea and sinus pain. Pertinent negatives include no ear pain, nausea, plugged ear sensation, sore throat, vomiting or wheezing. Associated symptoms comments: Body aches, fatigue. He has tried acetaminophen and antihistamine (zyrtec D) for the symptoms. The treatment provided no relief.      Problems:  Patient Active Problem List   Diagnosis Date Noted   Erectile dysfunction 09/13/2021   Morbid obesity (HCC) 01/01/2021   Dyslipidemia 04/20/2020   Type 2 diabetes mellitus with hyperglycemia (HCC) 04/20/2020    Allergies: No Known Allergies Medications:  Current Outpatient Medications:    fluticasone (FLONASE) 50 MCG/ACT nasal spray, Place 2 sprays into both nostrils daily., Disp: 16 g, Rfl: 0   nirmatrelvir/ritonavir EUA (PAXLOVID) 20 x 150 MG & 10 x 100MG  TABS, Take  3 tablets by mouth 2 (two) times daily for 5 days. (Take nirmatrelvir 150 mg two tablets twice daily for 5 days and ritonavir 100 mg one tablet twice daily for 5 days) Patient GFR is greater than 90, Disp: 30 tablet, Rfl: 0   atorvastatin (LIPITOR) 40 MG tablet, Take 1 tablet (40 mg total) by mouth daily., Disp: 90 tablet, Rfl: 3    SitaGLIPtin-MetFORMIN HCl (JANUMET XR) 50-1000 MG TB24, Take 1 tablet by mouth in the morning and at bedtime., Disp: 180 tablet, Rfl: 3   tadalafil (CIALIS) 10 MG tablet, Take 0.5-1 tablets (5-10 mg total) by mouth every other day as needed for erectile dysfunction., Disp: 30 tablet, Rfl: 1  Observations/Objective: Patient is well-developed, well-nourished in no acute distress.  Resting comfortably at home.  Head is normocephalic, atraumatic.  No labored breathing.  Speech is clear and coherent with logical content.  Patient is alert and oriented at baseline.    Assessment and Plan: 1. COVID-19 - MyChart COVID-19 home monitoring program; Future - fluticasone (FLONASE) 50 MCG/ACT nasal spray; Place 2 sprays into both nostrils daily.  Dispense: 16 g; Refill: 0 - nirmatrelvir/ritonavir EUA (PAXLOVID) 20 x 150 MG & 10 x 100MG  TABS; Take 3 tablets by mouth 2 (two) times daily for 5 days. (Take nirmatrelvir 150 mg two tablets twice daily for 5 days and ritonavir 100 mg one tablet twice daily for 5 days) Patient GFR is greater than 90  Dispense: 30 tablet; Refill: 0  - Continue OTC symptomatic management of choice - Will send OTC vitamins and supplement information through AVS - Paxlovid and Fluticasone prescribed - Patient enrolled in MyChart symptom monitoring - Push fluids - Rest as needed - Discussed return precautions and when to seek in-person evaluation, sent via AVS as well   Follow Up Instructions: I discussed the assessment and treatment plan with the patient. The patient was provided an opportunity to ask questions and all were answered. The patient agreed with the plan and demonstrated an understanding of the instructions.  A copy of instructions were sent to the patient via MyChart unless otherwise noted below.    The patient was advised to call back or seek an in-person evaluation if the symptoms worsen or if the condition fails to improve as anticipated.  Time:  I spent 13  minutes with the patient via telehealth technology discussing the above problems/concerns.    , PA-C

## 2022-03-30 NOTE — Progress Notes (Signed)
The patient no-showed for appointment despite this provider sending direct link, reaching out via phone with no response and waiting for at least 10 minutes from appointment time for patient to join. They will be marked as a NS for this appointment/time.  ? ?Cody Marks Cody Annalyce Lanpher, PA-C ? ? ? ?

## 2022-03-30 NOTE — Telephone Encounter (Signed)
Pt called d/t BPA triggered for worsening symptoms new diarrhea. Pt states he has already done VV today for COVID and been prescribed medications. Gave care advice and recommended if sx get worse to contact PCP. Pt verbalized understanding.    encourage patient to drink oral fluids and bland foods.  Continue to monitor for signs of dehydration (increased thirst decreased urine output, yellow urine, dry skin, headache or dizziness).  Advise patient to try OTC medication (Imodium, kaopectate, Pepto-Bismol) as per manufacturer's instructions. If worsening diarrhea occurs and becomes severe (6-7 bowel movements a day): notify PCP

## 2022-03-30 NOTE — Patient Instructions (Signed)
Soyla Dryer, thank you for joining Margaretann Loveless, PA-C for today's virtual visit.  While this provider is not your primary care provider (PCP), if your PCP is located in our provider database this encounter information will be shared with them immediately following your visit.   A [facility] MyChart account gives you access to today's visit and all your visits, tests, and labs performed at [facility] " click here if you don't have a [facility] MyChart account or go to [email or web address]  Consent: (Patient) Cody Marks provided verbal consent for this virtual visit at the beginning of the encounter.  Current Medications:  Current Outpatient Medications:    fluticasone (FLONASE) 50 MCG/ACT nasal spray, Place 2 sprays into both nostrils daily., Disp: 16 g, Rfl: 0   nirmatrelvir/ritonavir EUA (PAXLOVID) 20 x 150 MG & 10 x 100MG  TABS, Take 3 tablets by mouth 2 (two) times daily for 5 days. (Take nirmatrelvir 150 mg two tablets twice daily for 5 days and ritonavir 100 mg one tablet twice daily for 5 days) Patient GFR is greater than 90, Disp: 30 tablet, Rfl: 0   atorvastatin (LIPITOR) 40 MG tablet, Take 1 tablet (40 mg total) by mouth daily., Disp: 90 tablet, Rfl: 3   SitaGLIPtin-MetFORMIN HCl (JANUMET XR) 50-1000 MG TB24, Take 1 tablet by mouth in the morning and at bedtime., Disp: 180 tablet, Rfl: 3   tadalafil (CIALIS) 10 MG tablet, Take 0.5-1 tablets (5-10 mg total) by mouth every other day as needed for erectile dysfunction., Disp: 30 tablet, Rfl: 1   Medications ordered in this encounter:  Meds ordered this encounter  Medications   fluticasone (FLONASE) 50 MCG/ACT nasal spray    Sig: Place 2 sprays into both nostrils daily.    Dispense:  16 g    Refill:  0    Order Specific Question:   Supervising Provider    Answer:   Merrilee Jansky   nirmatrelvir/ritonavir EUA (PAXLOVID) 20 x 150 MG & 10 x 100MG  TABS    Sig: Take 3 tablets by mouth  2 (two) times daily for 5 days. (Take nirmatrelvir 150 mg two tablets twice daily for 5 days and ritonavir 100 mg one tablet twice daily for 5 days) Patient GFR is greater than 90    Dispense:  30 tablet    Refill:  0    Order Specific Question:   Supervising Provider    Answer:   [id number]     *If you need refills on other medications prior to your next appointment, please contact your pharmacy*  Follow-Up: Call back or seek an in-person evaluation if the symptoms worsen or if the condition fails to improve as anticipated.  [facility] [phone]  Other Instructions  COVID-19 COVID-19, or coronavirus disease 2019, is an infection that is caused by a new (novel) coronavirus called SARS-CoV-2. COVID-19 can cause many symptoms. In some people, the virus may not cause any symptoms. In others, it may cause mild or severe symptoms. Some people with severe infection develop severe disease. What are the causes? This illness is caused by a virus. The virus may be in the air as tiny specks of fluid (aerosols) or droplets, or it may be on surfaces. You may catch the virus by: Breathing in droplets from an infected person. Droplets can be spread by a person breathing, speaking, singing, coughing, or sneezing. Touching something, like a table or a doorknob, that has virus on it (is contaminated)  and then touching your mouth, nose, or eyes. What increases the risk? Risk for infection: You are more likely to get infected with the COVID-19 virus if: You are within 6 ft (1.8 m) of a person with COVID-19 for 15 minutes or longer. You are providing care for a person who is infected with COVID-19. You are in close personal contact with other people. Close personal contact includes hugging, kissing, or sharing eating or drinking utensils. Risk for serious illness caused by COVID-19: You are more likely to get seriously ill from the COVID-19 virus if: You have cancer. You  have a long-term (chronic) disease, such as: Chronic lung disease. This includes pulmonary embolism, chronic obstructive pulmonary disease, and cystic fibrosis. Long-term disease that lowers your body's ability to fight infection (immunocompromise). Serious cardiac conditions, such as heart failure, coronary artery disease, or cardiomyopathy. Diabetes. Chronic kidney disease. Liver diseases. These include cirrhosis, nonalcoholic fatty liver disease, alcoholic liver disease, or autoimmune hepatitis. You have obesity. You are pregnant or were recently pregnant. You have sickle cell disease. What are the signs or symptoms? Symptoms of this condition can range from mild to severe. Symptoms may appear any time from 2 to 14 days after being exposed to the virus. They include: Fever or chills. Shortness of breath or trouble breathing. Feeling tired or very tired. Headaches, body aches, or muscle aches. Runny or stuffy nose, sneezing, coughing, or sore throat. New loss of taste or smell. This is rare. Some people may also have stomach problems, such as nausea, vomiting, or diarrhea. Other people may not have any symptoms of COVID-19. How is this diagnosed? This condition may be diagnosed by testing samples to check for the COVID-19 virus. The most common tests are the PCR test and the antigen test. Tests may be done in the lab or at home. They include: Using a swab to take a sample of fluid from the back of your nose and throat (nasopharyngeal fluid), from your nose, or from your throat. Testing a sample of saliva from your mouth. Testing a sample of coughed-up mucus from your lungs (sputum). How is this treated? Treatment for COVID-19 infection depends on the severity of the condition. Mild symptoms can be managed at home with rest, fluids, and over-the-counter medicines. Serious symptoms may be treated in a hospital intensive care unit (ICU). Treatment in the ICU may include: Supplemental  oxygen. Extra oxygen is given through a tube in the nose, a face mask, or a hood. Medicines. These may include: Antivirals, such as monoclonal antibodies. These help your body fight off certain viruses that can cause disease. Anti-inflammatories, such as corticosteroids. These reduce inflammation and suppress the immune system. Antithrombotics. These prevent or treat blood clots, if they develop. Convalescent plasma. This helps boost your immune system, if you have an underlying immunosuppressive condition or are getting immunosuppressive treatments. Prone positioning. This means you will lie on your stomach. This helps oxygen to get into your lungs. Infection control measures. If you are at risk for more serious illness caused by COVID-19, your health care provider may prescribe two long-acting monoclonal antibodies, given together every 6 months. How is this prevented? To protect yourself: Use preventive medicine (pre-exposure prophylaxis). You may get pre-exposure prophylaxis if you have moderate or severe immunocompromise. Get vaccinated. Anyone 28 months old or older who meets guidelines can get a COVID-19 vaccine or vaccine series. This includes people who are pregnant or making breast milk (lactating). Get an added dose of COVID-19 vaccine after your first  vaccine or vaccine series if you have moderate to severe immunocompromise. This applies if you have had a solid organ transplant or have been diagnosed with an immunocompromising condition. You should get the added dose 4 weeks after you got the first COVID-19 vaccine or vaccine series. If you get an mRNA vaccine, you will need a 3-dose primary series. If you get the J&J/Janssen vaccine, you will need a 2-dose primary series, with the second dose being an mRNA vaccine. Talk to your health care provider about getting experimental monoclonal antibodies. This treatment is approved under emergency use authorization to prevent severe illness  before or after being exposed to the COVID-19 virus. You may be given monoclonal antibodies if: You have moderate or severe immunocompromise. This includes treatments that lower your immune response. People with immunocompromise may not develop protection against COVID-19 when they are vaccinated. You cannot be vaccinated. You may not get a vaccine if you have a severe allergic reaction to the vaccine or its components. You are not fully vaccinated. You are in a facility where COVID-19 is present and: Are in close contact with a person who is infected with the COVID-19 virus. Are at high risk of being exposed to the COVID-19 virus. You are at risk of illness from new variants of the COVID-19 virus. To protect others: If you have symptoms of COVID-19, take steps to prevent the virus from spreading to others. Stay home. Leave your house only to get medical care. Do not use public transit, if possible. Do not travel while you are sick. Wash your hands often with soap and water for at least 20 seconds. If soap and water are not available, use alcohol-based hand sanitizer. Make sure that all people in your household wash their hands well and often. Cough or sneeze into a tissue or your sleeve or elbow. Do not cough or sneeze into your hand or into the air. Where to find more information Centers for Disease Control and Prevention: https://www.clark-whitaker.org/ World Health Organization: https://thompson-craig.com/ Get help right away if: You have trouble breathing. You have pain or pressure in your chest. You are confused. You have bluish lips and fingernails. You have trouble waking from sleep. You have symptoms that get worse. These symptoms may be an emergency. Get help right away. Call 911. Do not wait to see if the symptoms will go away. Do not drive yourself to the hospital. Summary COVID-19 is an infection that is caused by a new coronavirus. Sometimes, there are no symptoms. Other  times, symptoms range from mild to severe. Some people with a severe COVID-19 infection develop severe disease. The virus that causes COVID-19 can spread from person to person through droplets or aerosols from breathing, speaking, singing, coughing, or sneezing. Mild symptoms of COVID-19 can be managed at home with rest, fluids, and over-the-counter medicines. This information is not intended to replace advice given to you by your health care provider. Make sure you discuss any questions you have with your health care provider. Document Revised: 03/30/2021 Document Reviewed: 04/01/2021 Elsevier Patient Education  2023 Elsevier Inc.    If you have been instructed to have an in-person evaluation today at a local Urgent Care facility, please use the link below. It will take you to a list of all of our available [facility], including address, phone number and hours of operation. Please do not delay care.  [facility]  If you or a family member do not have a primary care provider,  use the link below to schedule a visit and establish care. When you choose a [facility] primary care physician or advanced practice provider, you gain a long-term partner in health. Find a Primary Care Provider  Learn more about 's in-office and virtual care options: [facility]

## 2022-04-04 ENCOUNTER — Telehealth: Payer: BC Managed Care – PPO | Admitting: Family Medicine

## 2022-04-04 DIAGNOSIS — U071 COVID-19: Secondary | ICD-10-CM

## 2022-04-04 NOTE — Progress Notes (Signed)
[place]   Needs to see PCP for extended work note from COVID  Patient acknowledged agreement and understanding of the plan.

## 2022-04-05 ENCOUNTER — Encounter: Payer: Self-pay | Admitting: Family Medicine

## 2022-04-05 ENCOUNTER — Telehealth (INDEPENDENT_AMBULATORY_CARE_PROVIDER_SITE_OTHER): Payer: BC Managed Care – PPO | Admitting: Family Medicine

## 2022-04-05 VITALS — Ht 65.0 in | Wt 228.0 lb

## 2022-04-05 DIAGNOSIS — U071 COVID-19: Secondary | ICD-10-CM | POA: Diagnosis not present

## 2022-04-05 NOTE — Progress Notes (Signed)
   Cody Marks is a 46 y.o. male who presents today for a virtual office visit.  Assessment/Plan:  COVID Symptoms seem to be improving.  He has completed his course of paxlovid.  We discussed CDC isolation protocols and that it is okay for him to end isolation after 5 days if his symptoms have improved.  Discussed with patient that there is not much utility for at home antigen testing at this point given that he has clinically improved and I would not significantly change any of his management at this point.  He can continue taking over-the-counter meds as needed.  He should wear his mask for a total of 10 days.  We discussed reasons return to care.    Subjective:  HPI:  See A/p for status of chronic conditions.  Patient was diagnosed with covid 6 days ago. Was started on paxlovid. His symptoms have significantly improved.  He is out of work for at least 10 days to further protocol.  No fever or chills.  No reported chest pain or shortness of breath.       Objective/Observations  Physical Exam: Gen: NAD, resting comfortably Pulm: Normal work of breathing Neuro: Grossly normal, moves all extremities Psych: Normal affect and thought content  Virtual Visit via Video   I connected with Soyla Dryer on 04/05/22 at  8:20 AM EST by a video enabled telemedicine application and verified that I am speaking with the correct person using two identifiers. The limitations of evaluation and management by telemedicine and the availability of in person appointments were discussed. The patient expressed understanding and agreed to proceed.   Patient location: Home Provider location: [place] [facility] [employer] Persons participating in the virtual visit: Myself and Patient     Katina Degree. Jimmey Ralph, MD 04/05/2022 9:05 AM

## 2022-07-03 ENCOUNTER — Telehealth: Payer: BC Managed Care – PPO | Admitting: Family Medicine

## 2022-07-03 DIAGNOSIS — R6889 Other general symptoms and signs: Secondary | ICD-10-CM

## 2022-07-03 MED ORDER — OSELTAMIVIR PHOSPHATE 75 MG PO CAPS: 75.0000 mg | ORAL_CAPSULE | Freq: Two times a day (BID) | ORAL | 0 refills | Status: AC

## 2022-07-03 NOTE — Progress Notes (Signed)
Virtual Visit Consent   Cody Marks, you are scheduled for a virtual visit with a [facility] provider today. Just as with appointments in the office, your consent must be obtained to participate. Your consent will be active for this visit and any virtual visit you may have with one of our providers in the next 365 days. If you have a MyChart account, a copy of this consent can be sent to you electronically.  As this is a virtual visit, video technology does not allow for your provider to perform a traditional examination. This may limit your provider's ability to fully assess your condition. If your provider identifies any concerns that need to be evaluated in person or the need to arrange testing (such as labs, EKG, etc.), we will make arrangements to do so. Although advances in technology are sophisticated, we cannot ensure that it will always work on either your end or our end. If the connection with a video visit is poor, the visit may have to be switched to a telephone visit. With either a video or telephone visit, we are not always able to ensure that we have a secure connection.  By engaging in this virtual visit, you consent to the provision of healthcare and authorize for your insurance to be billed (if applicable) for the services provided during this visit. Depending on your insurance coverage, you may receive a charge related to this service.  I need to obtain your verbal consent now. Are you willing to proceed with your visit today? Cody Marks has provided verbal consent on 07/03/2022 for a virtual visit (video or telephone). Dellia Nims, FNP  Date: 07/03/2022 3:20 PM  Virtual Visit via Video Note   I, Dellia Nims, connected with  Asbury Driesenga  ([id number], December 26, 1975) on 07/03/22 at  3:15 PM EDT by a video-enabled telemedicine application and verified that I am speaking with the correct person using two identifiers.  Location: Patient: Virtual Visit Location  Patient: Home Provider: Virtual Visit Location Provider: Home Office   I discussed the limitations of evaluation and management by telemedicine and the availability of in person appointments. The patient expressed understanding and agreed to proceed.    History of Present Illness: Cody Marks is a 47 y.o. who identifies as a male who was assigned male at birth, and is being seen today for fever, aching, chills, sore throat, and runny nose for 2 days. Marland Kitchen  HPI: HPI  Problems:  Patient Active Problem List   Diagnosis Date Noted   Erectile dysfunction 09/13/2021   Morbid obesity ([place]) 01/01/2021   Dyslipidemia 04/20/2020   Type 2 diabetes mellitus with hyperglycemia ([place]) 04/20/2020    Allergies: No Known Allergies Medications:  Current Outpatient Medications:    oseltamivir (TAMIFLU) 75 MG capsule, Take 1 capsule (75 mg total) by mouth 2 (two) times daily for 5 days., Disp: 10 capsule, Rfl: 0   atorvastatin (LIPITOR) 40 MG tablet, Take 1 tablet (40 mg total) by mouth daily., Disp: 90 tablet, Rfl: 3   fluticasone (FLONASE) 50 MCG/ACT nasal spray, Place 2 sprays into both nostrils daily., Disp: 16 g, Rfl: 0   SitaGLIPtin-MetFORMIN HCl (JANUMET XR) 50-1000 MG TB24, Take 1 tablet by mouth in the morning and at bedtime., Disp: 180 tablet, Rfl: 3   tadalafil (CIALIS) 10 MG tablet, Take 0.5-1 tablets (5-10 mg total) by mouth every other day as needed for erectile dysfunction., Disp: 30 tablet, Rfl: 1  Observations/Objective: Patient is well-developed, well-nourished in no acute distress.  Resting  comfortably  at home.  Head is normocephalic, atraumatic.  No labored breathing.  Speech is clear and coherent with logical content.  Patient is alert and oriented at baseline.    Assessment and Plan: 1. Flu-like symptoms  Increase fluids, humidifier at night, tylenol or ibuprofen as directed, UC if sx persist or worsen. Do not go in public until he has not had a fever for 24 hrs without  tylenol or ibuprofen.   Follow Up Instructions: I discussed the assessment and treatment plan with the patient. The patient was provided an opportunity to ask questions and all were answered. The patient agreed with the plan and demonstrated an understanding of the instructions.  A copy of instructions were sent to the patient via MyChart unless otherwise noted below.     The patient was advised to call back or seek an in-person evaluation if the symptoms worsen or if the condition fails to improve as anticipated.  Time:  I spent 10 minutes with the patient via telehealth technology discussing the above problems/concerns.    Dellia Nims, FNP

## 2022-07-03 NOTE — Patient Instructions (Signed)

## 2022-07-26 ENCOUNTER — Telehealth: Payer: Self-pay | Admitting: Family Medicine

## 2022-07-26 ENCOUNTER — Other Ambulatory Visit: Payer: Self-pay

## 2022-07-26 MED ORDER — ATORVASTATIN CALCIUM 40 MG PO TABS: 40.0000 mg | ORAL_TABLET | Freq: Every day | ORAL | 3 refills | Status: DC

## 2022-07-26 MED ORDER — JANUMET XR 50-1000 MG PO TB24
1.0000 | ORAL_TABLET | Freq: Two times a day (BID) | ORAL | 3 refills | Status: DC
Start: 2022-07-26 — End: 2023-03-30

## 2022-07-26 NOTE — Telephone Encounter (Signed)
LAST APPOINTMENT DATE:  04/05/22  NEXT APPOINTMENT DATE: 09/20/22  MEDICATION:  atorvastatin (LIPITOR) 40 MG tablet  SitaGLIPtin-MetFORMIN HCl (JANUMET XR) 50-1000 MG TB24  tadalafil (CIALIS) 10 MG tablet    Is the patient out of medication? Very low  PHARMACY:  [facility] [address] [place] [address] Phone: [phone]  Fax: [phone]      Let patient know to contact pharmacy at the end of the day to make sure medication is ready.  Please notify patient to allow 48-72 hours to process

## 2022-07-27 ENCOUNTER — Other Ambulatory Visit: Payer: Self-pay

## 2022-07-27 DIAGNOSIS — N529 Male erectile dysfunction, unspecified: Secondary | ICD-10-CM

## 2022-07-27 MED ORDER — TADALAFIL 10 MG PO TABS: 5.0000 mg | ORAL_TABLET | ORAL | 1 refills | Status: DC | PRN

## 2022-07-27 NOTE — Telephone Encounter (Signed)
Ok with me. Please place any necessary orders. 

## 2022-07-27 NOTE — Telephone Encounter (Signed)
Rx sent 

## 2022-09-13 LAB — HM DIABETES EYE EXAM

## 2022-09-20 ENCOUNTER — Encounter: Payer: Self-pay | Admitting: Family Medicine

## 2022-09-20 ENCOUNTER — Ambulatory Visit (INDEPENDENT_AMBULATORY_CARE_PROVIDER_SITE_OTHER): Payer: BC Managed Care – PPO | Admitting: Family Medicine

## 2022-09-20 VITALS — BP 124/81 | HR 75 | Temp 97.7°F | Ht 65.0 in | Wt 245.6 lb

## 2022-09-20 DIAGNOSIS — E1169 Type 2 diabetes mellitus with other specified complication: Secondary | ICD-10-CM | POA: Diagnosis not present

## 2022-09-20 DIAGNOSIS — I8390 Asymptomatic varicose veins of unspecified lower extremity: Secondary | ICD-10-CM | POA: Insufficient documentation

## 2022-09-20 DIAGNOSIS — Z0001 Encounter for general adult medical examination with abnormal findings: Secondary | ICD-10-CM | POA: Diagnosis not present

## 2022-09-20 DIAGNOSIS — Z1211 Encounter for screening for malignant neoplasm of colon: Secondary | ICD-10-CM

## 2022-09-20 DIAGNOSIS — E1165 Type 2 diabetes mellitus with hyperglycemia: Secondary | ICD-10-CM | POA: Diagnosis not present

## 2022-09-20 DIAGNOSIS — E785 Hyperlipidemia, unspecified: Secondary | ICD-10-CM | POA: Diagnosis not present

## 2022-09-20 DIAGNOSIS — Z7984 Long term (current) use of oral hypoglycemic drugs: Secondary | ICD-10-CM

## 2022-09-20 LAB — LIPID PANEL
Cholesterol: 164 mg/dL (ref 0–200)
HDL: 45.4 mg/dL (ref >39.00)
LDL Cholesterol: 101 mg/dL — ABNORMAL HIGH (ref 0–99)
NonHDL: 118.28
Total CHOL/HDL Ratio: 4
Triglycerides: 87 mg/dL (ref 0.0–149.0)
VLDL: 17.4 mg/dL (ref 0.0–40.0)

## 2022-09-20 LAB — TSH: TSH: 1.91 u[IU]/mL (ref 0.35–5.50)

## 2022-09-20 LAB — COMPREHENSIVE METABOLIC PANEL
ALT: 19 U/L (ref 0–53)
AST: 19 U/L (ref 0–37)
Albumin: 3.7 g/dL (ref 3.5–5.2)
Alkaline Phosphatase: 76 U/L (ref 39–117)
BUN: 12 mg/dL (ref 6–23)
CO2: 28 mEq/L (ref 19–32)
Calcium: 9 mg/dL (ref 8.4–10.5)
Chloride: 102 mEq/L (ref 96–112)
Creatinine, Ser: 0.85 mg/dL (ref 0.40–1.50)
GFR: 105.37 mL/min (ref >60.00)
Glucose, Bld: 150 mg/dL — ABNORMAL HIGH (ref 70–99)
Potassium: 4.8 mEq/L (ref 3.5–5.1)
Sodium: 138 mEq/L (ref 135–145)
Total Bilirubin: 0.4 mg/dL (ref 0.2–1.2)
Total Protein: 7 g/dL (ref 6.0–8.3)

## 2022-09-20 LAB — HEMOGLOBIN A1C: Hgb A1c MFr Bld: 8 % — ABNORMAL HIGH (ref 4.6–6.5)

## 2022-09-20 LAB — CBC
HCT: 36.8 % — ABNORMAL LOW (ref 39.0–52.0)
Hemoglobin: 11.8 g/dL — ABNORMAL LOW (ref 13.0–17.0)
MCHC: 32.1 g/dL (ref 30.0–36.0)
MCV: 82.7 fl (ref 78.0–100.0)
Platelets: 235 10*3/uL (ref 150.0–400.0)
RBC: 4.45 Mil/uL (ref 4.22–5.81)
RDW: 14.9 % (ref 11.5–15.5)
WBC: 6.9 10*3/uL (ref 4.0–10.5)

## 2022-09-20 LAB — MICROALBUMIN / CREATININE URINE RATIO
Creatinine,U: 116.7 mg/dL
Microalb Creat Ratio: 0.6 mg/g (ref 0.0–30.0)
Microalb, Ur: <0.7 mg/dL (ref 0.0–1.9)

## 2022-09-20 MED ORDER — SACCHAROMYCES BOULARDII 250 MG PO CAPS: 250.0000 mg | ORAL_CAPSULE | Freq: Two times a day (BID) | ORAL | 11 refills | Status: DC

## 2022-09-20 NOTE — Progress Notes (Signed)
Chief Complaint:  Cody Marks is a 47 y.o. male who presents today for his annual comprehensive physical exam.    Assessment/Plan:  Chronic Problems Addressed Today: Type 2 diabetes mellitus with hyperglycemia (HCC) Check A1c.  He is on Janumet XR 50-1000 twice daily.  Still having a lot of loose stools with this.  Will send in probiotic per patient request.  He would like to avoid GLP-1 agonist due to his history of perineal cellulitis a couple of years ago.  We need also avoid SGLT2 inhibitors because of this.  If A1c is not at goal would consider adding on insulin vs referral to endo.   Varicose veins of calf No red flags.  Discussed conservative measures including avoidance of standing or sitting in the same position for long periods of time, leg elevation, compression stockings, and salt avoidance. He will let us know if not improving or if he develops any symptoms and we can refer to vascular surgery.  Dyslipidemia associated with type 2 diabetes mellitus (HCC) Check lipids.  On Lipitor 40 mg daily.  Tolerating well.  Preventative Healthcare: Check labs.  Will be due for colon cancer screening next month-will refer for colonoscopy.  Up-to-date on vaccines.  Patient Counseling(The following topics were reviewed and/or handout was given):  -Nutrition: Stressed importance of moderation in sodium/caffeine intake, saturated fat and cholesterol, caloric balance, sufficient intake of fresh fruits, vegetables, and fiber.  -Stressed the importance of regular exercise.   -Substance Abuse: Discussed cessation/primary prevention of tobacco, alcohol, or other drug use; driving or other dangerous activities under the influence; availability of treatment for abuse.   -Injury prevention: Discussed safety belts, safety helmets, smoke detector, smoking near bedding or upholstery.   -Sexuality: Discussed sexually transmitted diseases, partner selection, use of condoms, avoidance of unintended  pregnancy and contraceptive alternatives.   -Dental health: Discussed importance of regular tooth brushing, flossing, and dental visits.  -Health maintenance and immunizations reviewed. Please refer to Health maintenance section.  Return to care in 1 year for next preventative visit.     Subjective:  HPI:  He has no acute complaints today. See Assessment / plan for status of chronic conditions.   Patient is also wondering what he can do for varicose veins in the left leg.  They have been present for several years.  No family history.  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  Lifestyle Diet: Balanced. Plenty of fruits and vegetables.  Exercise: None specific.      09/20/2022    7:55 AM  Depression screen PHQ 2/9  Decreased Interest 0  Down, Depressed, Hopeless 0  PHQ - 2 Score 0    Health Maintenance Due  Topic Date Due   FOOT EXAM  Never done   Diabetic kidney evaluation - Urine ACR  04/20/2021   Diabetic kidney evaluation - eGFR measurement  06/15/2022     ROS: Per HPI, otherwise a complete review of systems was negative.   PMH:  The following were reviewed and entered/updated in epic: Past Medical History:  Diagnosis Date   Tuberculosis    Patient Active Problem List   Diagnosis Date Noted   Varicose veins of calf 09/20/2022   Erectile dysfunction 09/13/2021   Morbid obesity (HCC) 01/01/2021   Dyslipidemia associated with type 2 diabetes mellitus (HCC) 04/20/2020   Type 2 diabetes mellitus with hyperglycemia (HCC) 04/20/2020   Past Surgical History:  Procedure Laterality Date   VASECTOMY  Family History  Problem Relation Age of Onset   COPD Mother     Medications- reviewed and updated Current Outpatient Medications  Medication Sig Dispense Refill   atorvastatin (LIPITOR) 40 MG tablet Take 1 tablet (40 mg total) by mouth daily. 90 tablet 3   fluticasone (FLONASE) 50 MCG/ACT nasal spray Place 2 sprays into both nostrils  daily. 16 g 0   saccharomyces boulardii (FLORASTOR) 250 MG capsule Take 1 capsule (250 mg total) by mouth 2 (two) times daily. 60 capsule 11   SitaGLIPtin-MetFORMIN HCl (JANUMET XR) 50-1000 MG TB24 Take 1 tablet by mouth in the morning and at bedtime. 180 tablet 3   tadalafil (CIALIS) 10 MG tablet Take 0.5-1 tablets (5-10 mg total) by mouth every other day as needed for erectile dysfunction. 30 tablet 1   No current facility-administered medications for this visit.    Allergies-reviewed and updated No Known Allergies  Social History   Socioeconomic History   Marital status: Married    Spouse name: Not on file   Number of children: Not on file   Years of education: Not on file   Highest education level: Not on file  Occupational History   Not on file  Tobacco Use   Smoking status: Never   Smokeless tobacco: Never  Substance and Sexual Activity   Alcohol use: Not on file   Drug use: Not on file   Sexual activity: Not on file  Other Topics Concern   Not on file  Social History Narrative   Not on file   Social Determinants of Health   Financial Resource Strain: Not on file  Food Insecurity: Not on file  Transportation Needs: Not on file  Physical Activity: Not on file  Stress: Not on file  Social Connections: Not on file        Objective:  Physical Exam: BP 124/81   Pulse 75   Temp 97.7 F (36.5 C) (Temporal)   Ht 5\' 5"  (1.651 m)   Wt 245 lb 9.6 oz (111.4 kg)   SpO2 100%   BMI 40.87 kg/m   Body mass index is 40.87 kg/m. Wt Readings from Last 3 Encounters:  09/20/22 245 lb 9.6 oz (111.4 kg)  04/05/22 228 lb (103.4 kg)  03/23/22 228 lb 12.8 oz (103.8 kg)   Gen: NAD, resting comfortably HEENT: TMs normal bilaterally. OP clear. No thyromegaly noted.  CV: RRR with no murmurs appreciated Pulm: NWOB, CTAB with no crackles, wheezes, or rhonchi GI: Normal bowel sounds present. Soft, Nontender, Nondistended. MSK: no edema, cyanosis, or clubbing noted.   Varicosities noted on left posterior calf and popliteal fossa. Skin: warm, dry Neuro: CN2-12 grossly intact. Strength 5/5 in upper and lower extremities. Reflexes symmetric and intact bilaterally.  Psych: Normal affect and thought content     Lanitra Battaglini M. Jimmey Ralph, MD 09/20/2022 8:23 AM

## 2022-09-20 NOTE — Progress Notes (Signed)
A1c is up to 8.0.  We need to work on getting this down closer to 7.  We can start an additional medication called Actos if he wishes or he can continue to work on diet and exercise and we can recheck this again in about 3 months.  The rest of his labs are all stable.  He is very slightly anemic but this is near his baseline.  He has been referred for colonoscopy as we discussed at his office visit today.    It would probably be a good idea for [place] to check a few additional labs to make sure this is not due to a deficiency.  Please place order for iron panel, B12, and folate.  We can add these onto the lab if possible or he can come back to have these done.

## 2022-09-20 NOTE — Assessment & Plan Note (Signed)
Check lipids.  On Lipitor 40 mg daily.  Tolerating well. °

## 2022-09-20 NOTE — Patient Instructions (Signed)
It was very nice to see you today!  We will check blood work.  Please continue to work on diet and exercise.  I will refer you for your colonoscopy.  Return in about 1 year (around 09/20/2023) for Annual Physical.   Take care, Dr Jimmey Ralph  PLEASE NOTE:  If you had any lab tests, please let us know if you have not heard back within a few days. You may see your results on mychart before we have a chance to review them but we will give you a call once they are reviewed by [place].   If we ordered any referrals today, please let us know if you have not heard from their office within the next week.   If you had any urgent prescriptions sent in today, please check with the pharmacy within an hour of our visit to make sure the prescription was transmitted appropriately.   Please try these tips to maintain a healthy lifestyle:  Eat at least 3 REAL meals and 1-2 snacks per day.  Aim for no more than 5 hours between eating.  If you eat breakfast, please do so within one hour of getting up.   Each meal should contain half fruits/vegetables, one quarter protein, and one quarter carbs (no bigger than a computer mouse)  Cut down on sweet beverages. This includes juice, soda, and sweet tea.   Drink at least 1 glass of water with each meal and aim for at least 8 glasses per day  Exercise at least 150 minutes every week.    Preventive Care 92-75 Years Old, Male Preventive care refers to lifestyle choices and visits with your health care provider that can promote health and wellness. Preventive care visits are also called wellness exams. What can I expect for my preventive care visit? Counseling During your preventive care visit, your health care provider may ask about your: Medical history, including: Past medical problems. Family medical history. Current health, including: Emotional well-being. Home life and relationship well-being. Sexual activity. Lifestyle, including: Alcohol, nicotine or  tobacco, and drug use. Access to firearms. Diet, exercise, and sleep habits. Safety issues such as seatbelt and bike helmet use. Sunscreen use. Work and work Astronomer. Physical exam Your health care provider will check your: Height and weight. These may be used to calculate your BMI (body mass index). BMI is a measurement that tells if you are at a healthy weight. Waist circumference. This measures the distance around your waistline. This measurement also tells if you are at a healthy weight and may help predict your risk of certain diseases, such as type 2 diabetes and high blood pressure. Heart rate and blood pressure. Body temperature. Skin for abnormal spots. What immunizations do I need?  Vaccines are usually given at various ages, according to a schedule. Your health care provider will recommend vaccines for you based on your age, medical history, and lifestyle or other factors, such as travel or where you work. What tests do I need? Screening Your health care provider may recommend screening tests for certain conditions. This may include: Lipid and cholesterol levels. Diabetes screening. This is done by checking your blood sugar (glucose) after you have not eaten for a while (fasting). Hepatitis B test. Hepatitis C test. HIV (human immunodeficiency virus) test. STI (sexually transmitted infection) testing, if you are at risk. Lung cancer screening. Prostate cancer screening. Colorectal cancer screening. Talk with your health care provider about your test results, treatment options, and if necessary, the need for more tests.  Follow these instructions at home: Eating and drinking  Eat a diet that includes fresh fruits and vegetables, whole grains, lean protein, and low-fat dairy products. Take vitamin and mineral supplements as recommended by your health care provider. Do not drink alcohol if your health care provider tells you not to drink. If you drink alcohol: Limit how  much you have to 0-2 drinks a day. Know how much alcohol is in your drink. In the [place] one drink equals one 12 oz bottle of beer (355 mL), one 5 oz glass of wine (148 mL), or one 1 oz glass of hard liquor (44 mL). Lifestyle Brush your teeth every morning and night with fluoride toothpaste. Floss one time each day. Exercise for at least 30 minutes 5 or more days each week. Do not use any products that contain nicotine or tobacco. These products include cigarettes, chewing tobacco, and vaping devices, such as e-cigarettes. If you need help quitting, ask your health care provider. Do not use drugs. If you are sexually active, practice safe sex. Use a condom or other form of protection to prevent STIs. Take aspirin only as told by your health care provider. Make sure that you understand how much to take and what form to take. Work with your health care provider to find out whether it is safe and beneficial for you to take aspirin daily. Find healthy ways to manage stress, such as: Meditation, yoga, or listening to music. Journaling. Talking to a trusted person. Spending time with friends and family. Minimize exposure to UV radiation to reduce your risk of skin cancer. Safety Always wear your seat belt while driving or riding in a vehicle. Do not drive: If you have been drinking alcohol. Do not ride with someone who has been drinking. When you are tired or distracted. While texting. If you have been using any mind-altering substances or drugs. Wear a helmet and other protective equipment during sports activities. If you have firearms in your house, make sure you follow all gun safety procedures. What's next? Go to your health care provider once a year for an annual wellness visit. Ask your health care provider how often you should have your eyes and teeth checked. Stay up to date on all vaccines. This information is not intended to replace advice given to you by your health care provider.  Make sure you discuss any questions you have with your health care provider. Document Revised: 10/07/2020 Document Reviewed: 10/07/2020 Elsevier Patient Education  2024 [employer].

## 2022-09-20 NOTE — Assessment & Plan Note (Signed)
Check A1c.  He is on Janumet XR 50-1000 twice daily.  Still having a lot of loose stools with this.  Will send in probiotic per patient request.  He would like to avoid GLP-1 agonist due to his history of perineal cellulitis a couple of years ago.  We need also avoid SGLT2 inhibitors because of this.  If A1c is not at goal would consider adding on insulin vs referral to endo.

## 2022-09-20 NOTE — Assessment & Plan Note (Signed)
No red flags.  Discussed conservative measures including avoidance of standing or sitting in the same position for long periods of time, leg elevation, compression stockings, and salt avoidance. He will let us know if not improving or if he develops any symptoms and we can refer to vascular surgery.

## 2022-09-21 ENCOUNTER — Other Ambulatory Visit: Payer: Self-pay | Admitting: *Deleted

## 2022-09-21 DIAGNOSIS — D509 Iron deficiency anemia, unspecified: Secondary | ICD-10-CM

## 2022-09-22 NOTE — Progress Notes (Signed)
Ok to send in Actos 15 mg daily. We need to recheck A1c in 3 months.  Cody Marks. Jimmey Ralph, MD 09/22/2022 12:53 PM

## 2022-09-23 ENCOUNTER — Other Ambulatory Visit: Payer: Self-pay | Admitting: *Deleted

## 2022-09-23 MED ORDER — PIOGLITAZONE HCL 15 MG PO TABS: 15.0000 mg | ORAL_TABLET | Freq: Every day | ORAL | 1 refills | Status: DC

## 2022-09-26 ENCOUNTER — Other Ambulatory Visit (INDEPENDENT_AMBULATORY_CARE_PROVIDER_SITE_OTHER): Payer: BC Managed Care – PPO

## 2022-09-26 DIAGNOSIS — Z0001 Encounter for general adult medical examination with abnormal findings: Secondary | ICD-10-CM

## 2022-09-26 DIAGNOSIS — D509 Iron deficiency anemia, unspecified: Secondary | ICD-10-CM

## 2022-09-26 LAB — B12 AND FOLATE PANEL
Folate: 7.5 ng/mL (ref >5.9)
Vitamin B-12: 224 pg/mL (ref 211–911)

## 2022-09-27 LAB — IRON,TIBC AND FERRITIN PANEL
%SAT: 11 % (calc) — ABNORMAL LOW (ref 20–48)
Ferritin: 18 ng/mL — ABNORMAL LOW (ref 38–380)
Iron: 43 ug/dL — ABNORMAL LOW (ref 50–180)
TIBC: 385 mcg/dL (calc) (ref 250–425)

## 2022-09-27 NOTE — Progress Notes (Signed)
His iron counts are low.  Recommend he start ferrous sulfate 65 mg every other day on an empty stomach.  We need to make sure that he gets his cancer screening soon.  Please make sure that he has a colonoscopy appointment scheduled.  We should recheck labs in about 3 months.

## 2022-09-28 ENCOUNTER — Encounter: Payer: Self-pay | Admitting: Gastroenterology

## 2022-11-24 IMAGING — CT CT ABD-PELV W/ CM
2 of 5 series · 16 of 46 positions shown, 18 images · IV contrast (APPLIED)
Comparison: None

CLINICAL DATA: Perineal abscess, area was lanced yesterday,
worsened pain and increased scrotal swelling, diabetes mellitus,
question Cebza gangrene

EXAM:
CT ABDOMEN AND PELVIS WITH CONTRAST
TECHNIQUE: Multidetector CT imaging of the abdomen and pelvis was performed
using the standard protocol following bolus administration of
intravenous contrast.

[Series 2: abd pel w · axial · 0.89mm/px · z∈[+699,+1199]mm · 13 of 112 slices shown, 15 images]
[im 6/112  soft-tissue]
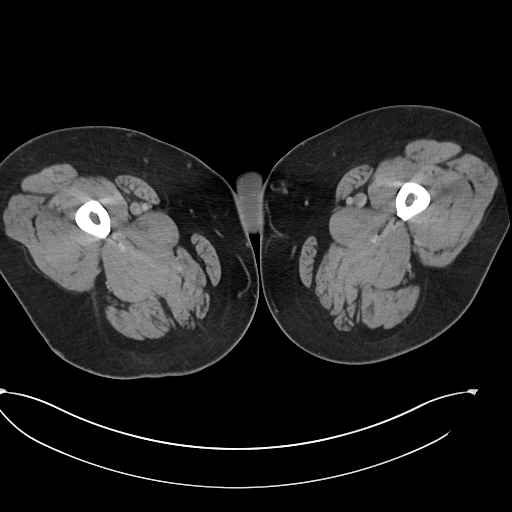
[im 6/112  bone]
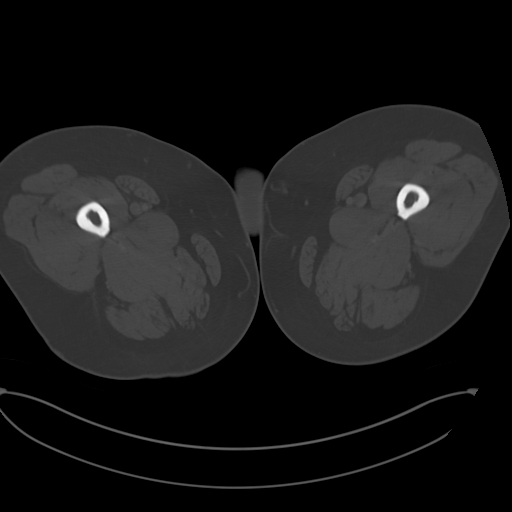
[im 17/112  soft-tissue]
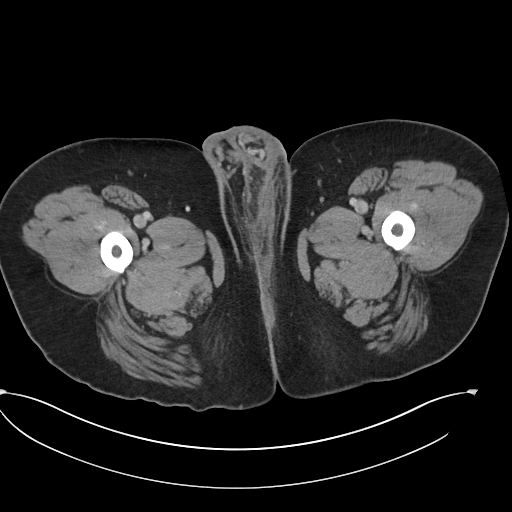
[im 23/112  soft-tissue]
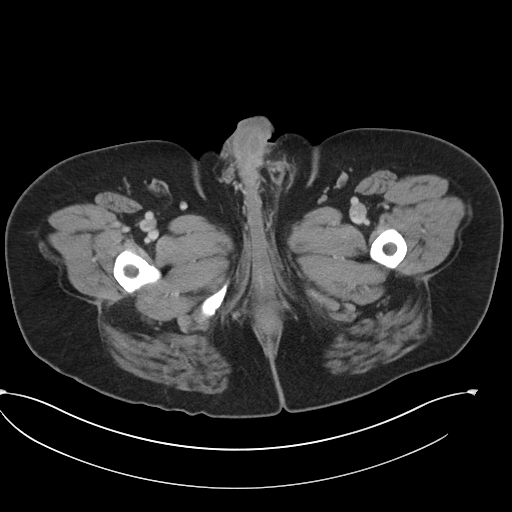
[im 34/112  soft-tissue]
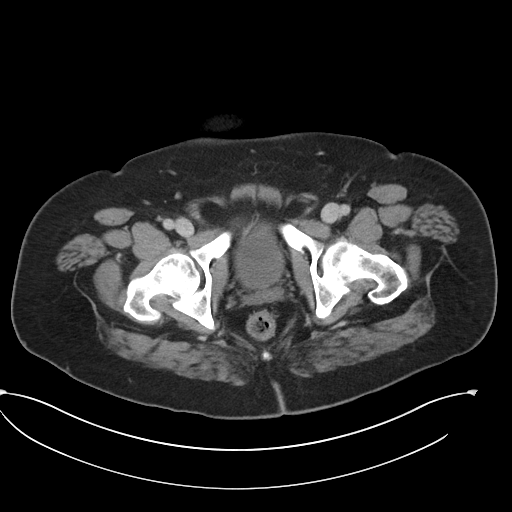
[im 39/112  soft-tissue]
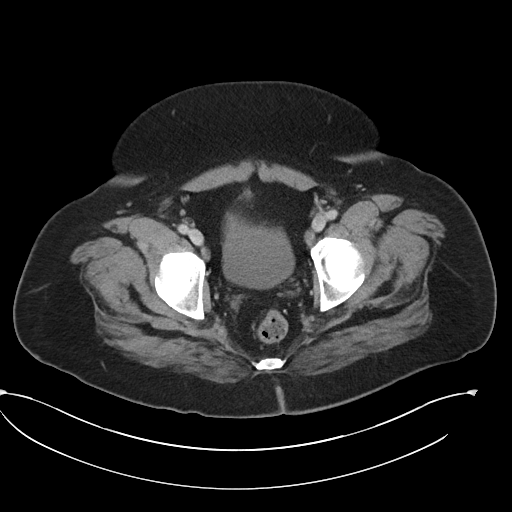
[im 50/112  soft-tissue]
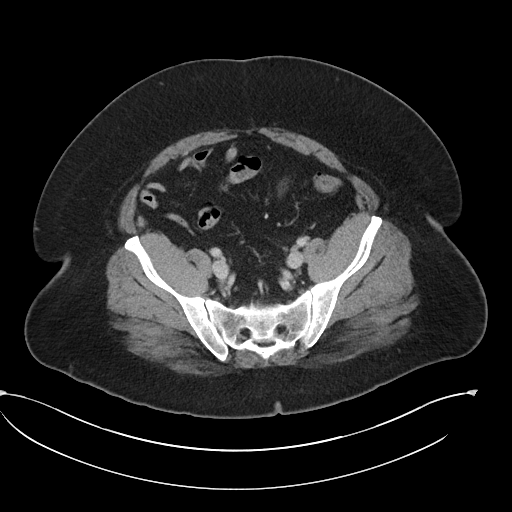
[im 56/112  soft-tissue]
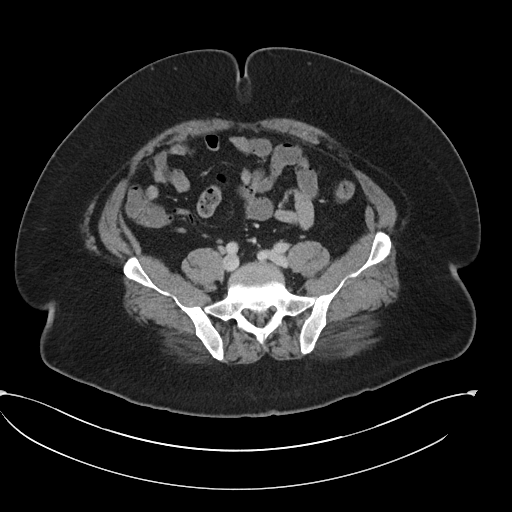
[im 62/112  soft-tissue]
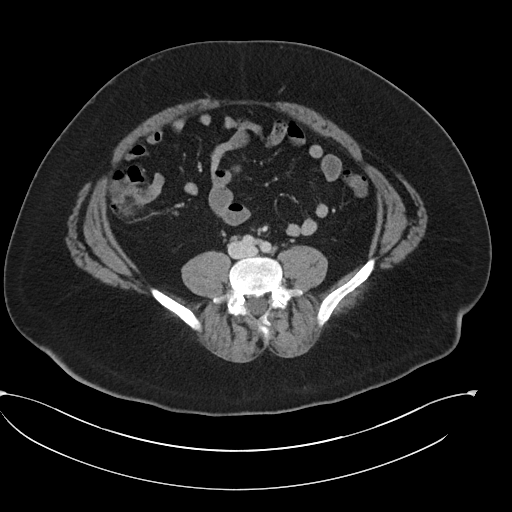
[im 73/112  soft-tissue]
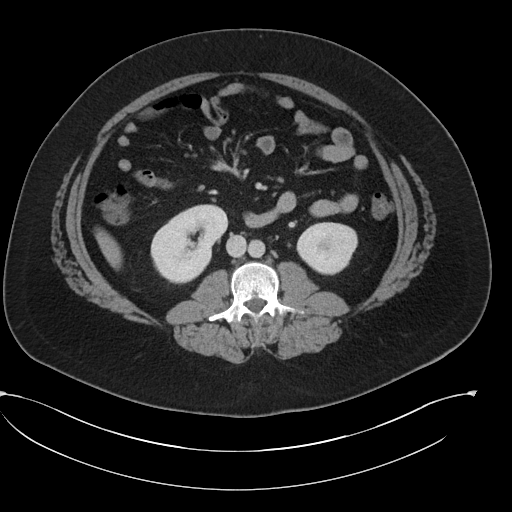
[im 73/112  bone]
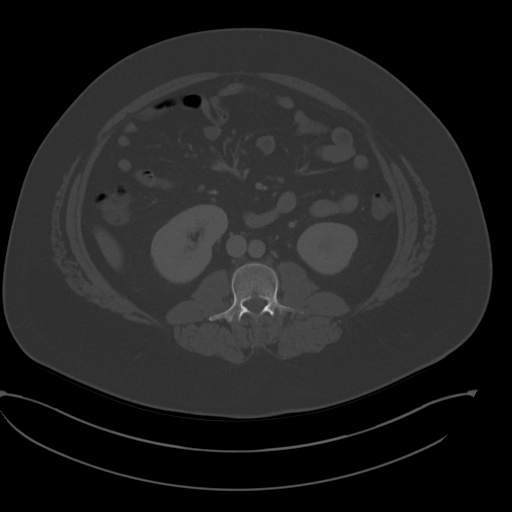
[im 78/112  soft-tissue]
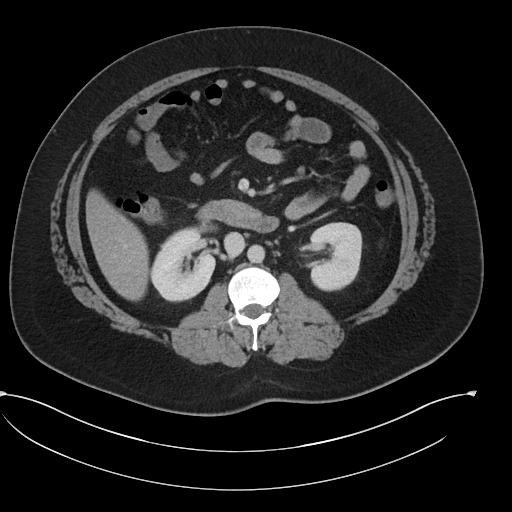
[im 89/112  soft-tissue]
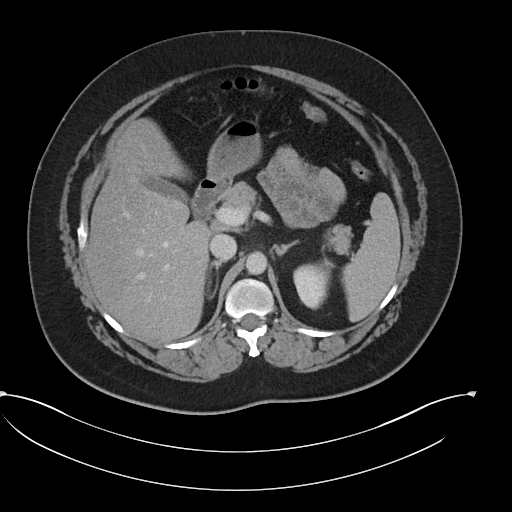
[im 95/112  soft-tissue]
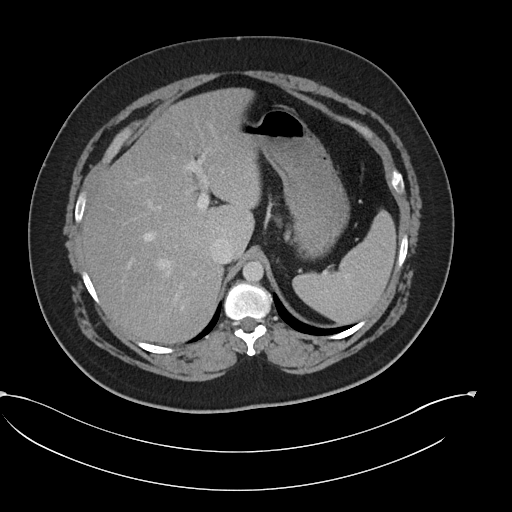
[im 106/112  soft-tissue]
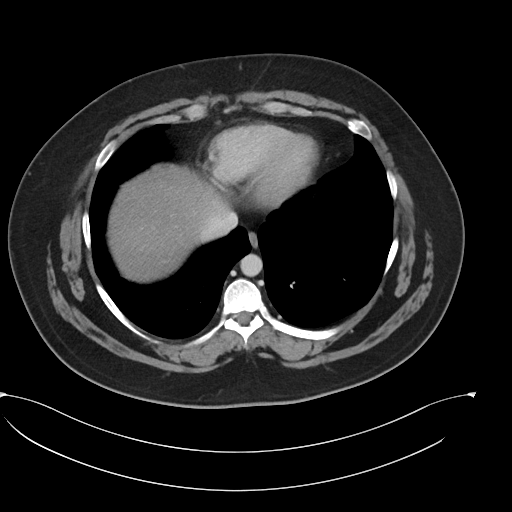

[Series 5: coronal · coronal · 0.94mm/px · 3 of 121 slices shown]
[im 41/121  soft-tissue]
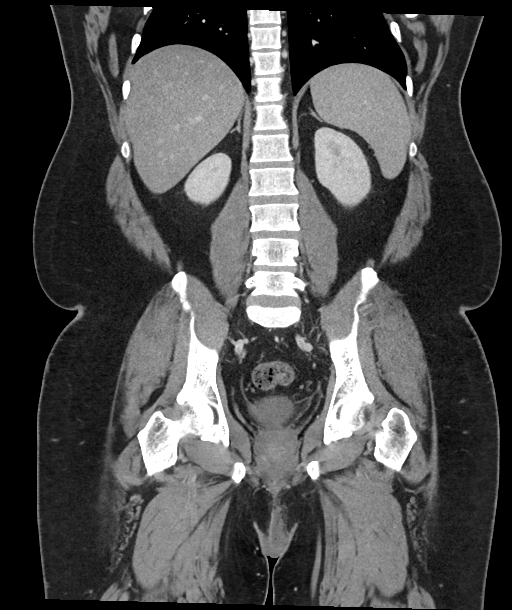
[im 54/121  soft-tissue]
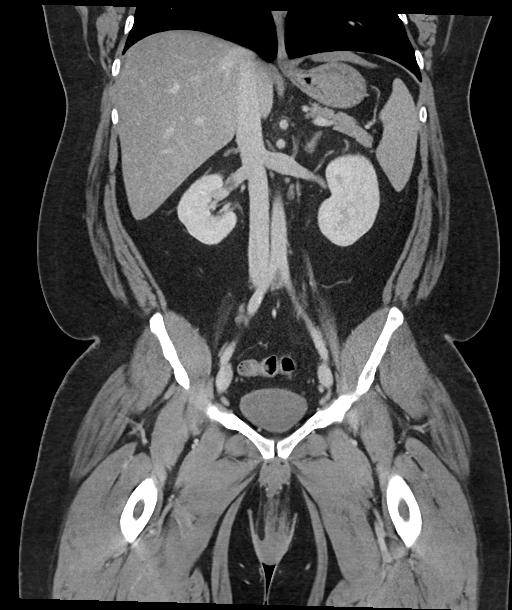
[im 67/121  soft-tissue]
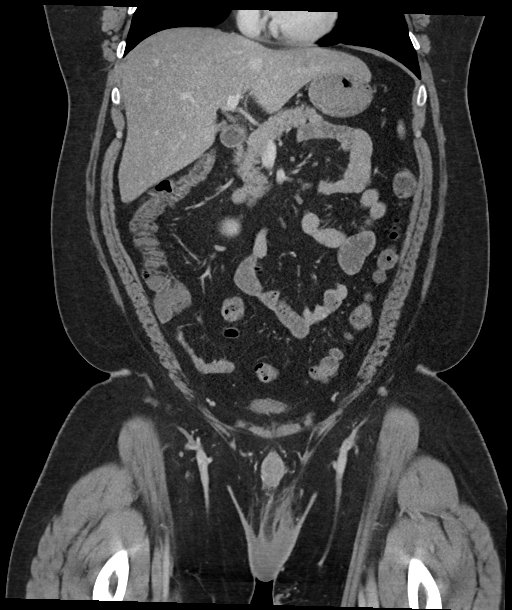

[16 of 46 positions shown; findings below may reference images not displayed]

RADIATION DOSE REDUCTION: This exam was performed according to the
departmental dose-optimization program which includes automated
exposure control, adjustment of the mA and/or kV according to
patient size and/or use of iterative reconstruction technique.

CONTRAST:  100mL OMNIPAQUE IOHEXOL 300 MG/ML SOLN IV. No oral
contrast.
FINDINGS: Lower chest: Lung bases clear

Hepatobiliary: Gallbladder and liver normal appearance

Pancreas: Normal appearance

Spleen: Normal appearance

Adrenals/Urinary Tract: Adrenal glands, kidneys, ureters, and
bladder normal appearance

Stomach/Bowel: Normal appendix. Stomach and bowel loops normal
appearance

Vascular/Lymphatic: Vascular structures patent. Aorta normal
caliber. No adenopathy.

Reproductive: Unremarkable prostate gland and seminal vesicles

Other: Umbilical hernia containing fat. No free air or free fluid.
Diffuse infiltrative changes in the perineum greater on LEFT
consistent with cellulitis. Edema extends into the scrotum. No
extension to the perianal or perirectal regions. Scattered edema
within the soft tissues in the LEFT perineum without discrete
drainable collection. Tiny LEFT inguinal hernia containing fat. No
abnormal foci of soft tissue gas identified.

Musculoskeletal: BILATERAL spondylolysis L5 with minimal
spondylolisthesis.
IMPRESSION: Diffuse infiltrative changes in the perineum greater on LEFT
consistent with cellulitis, extending into scrotum.

Scattered edema within the soft tissues in the LEFT perineum without
discrete drainable collection.

No abnormal soft tissue gas identified to suggest necrotizing
fasciitis/Cebza gangrene.

Tiny LEFT inguinal hernia containing fat.

Umbilical hernia containing fat.

BILATERAL spondylolysis L5 with minimal spondylolisthesis.

## 2022-12-21 IMAGING — DX DG CHEST 2V
2 series · 2 of 2 positions shown · non-contrast
Comparison: None.

CLINICAL DATA: 45-year-old male with a history of screening study
for work

EXAM:
CHEST - 2 VIEW

[chest pa]
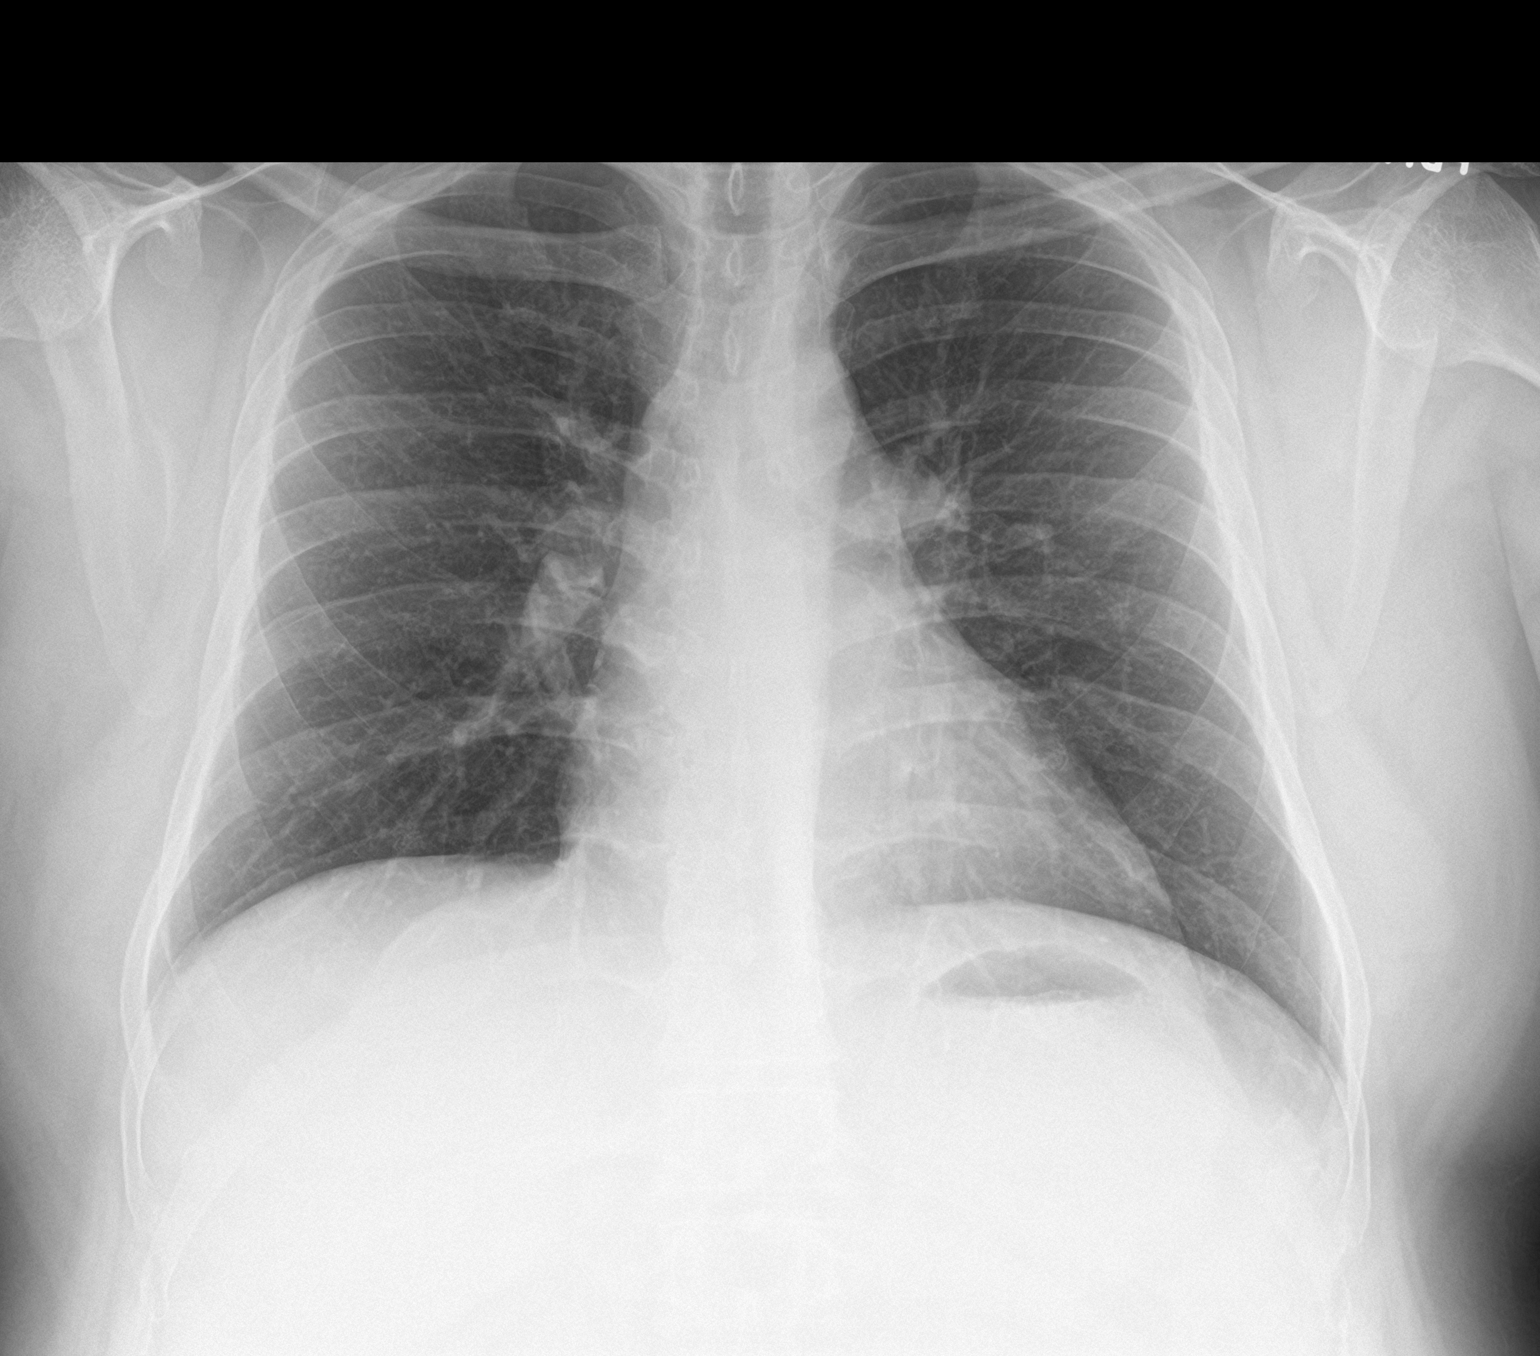

[chest lat]
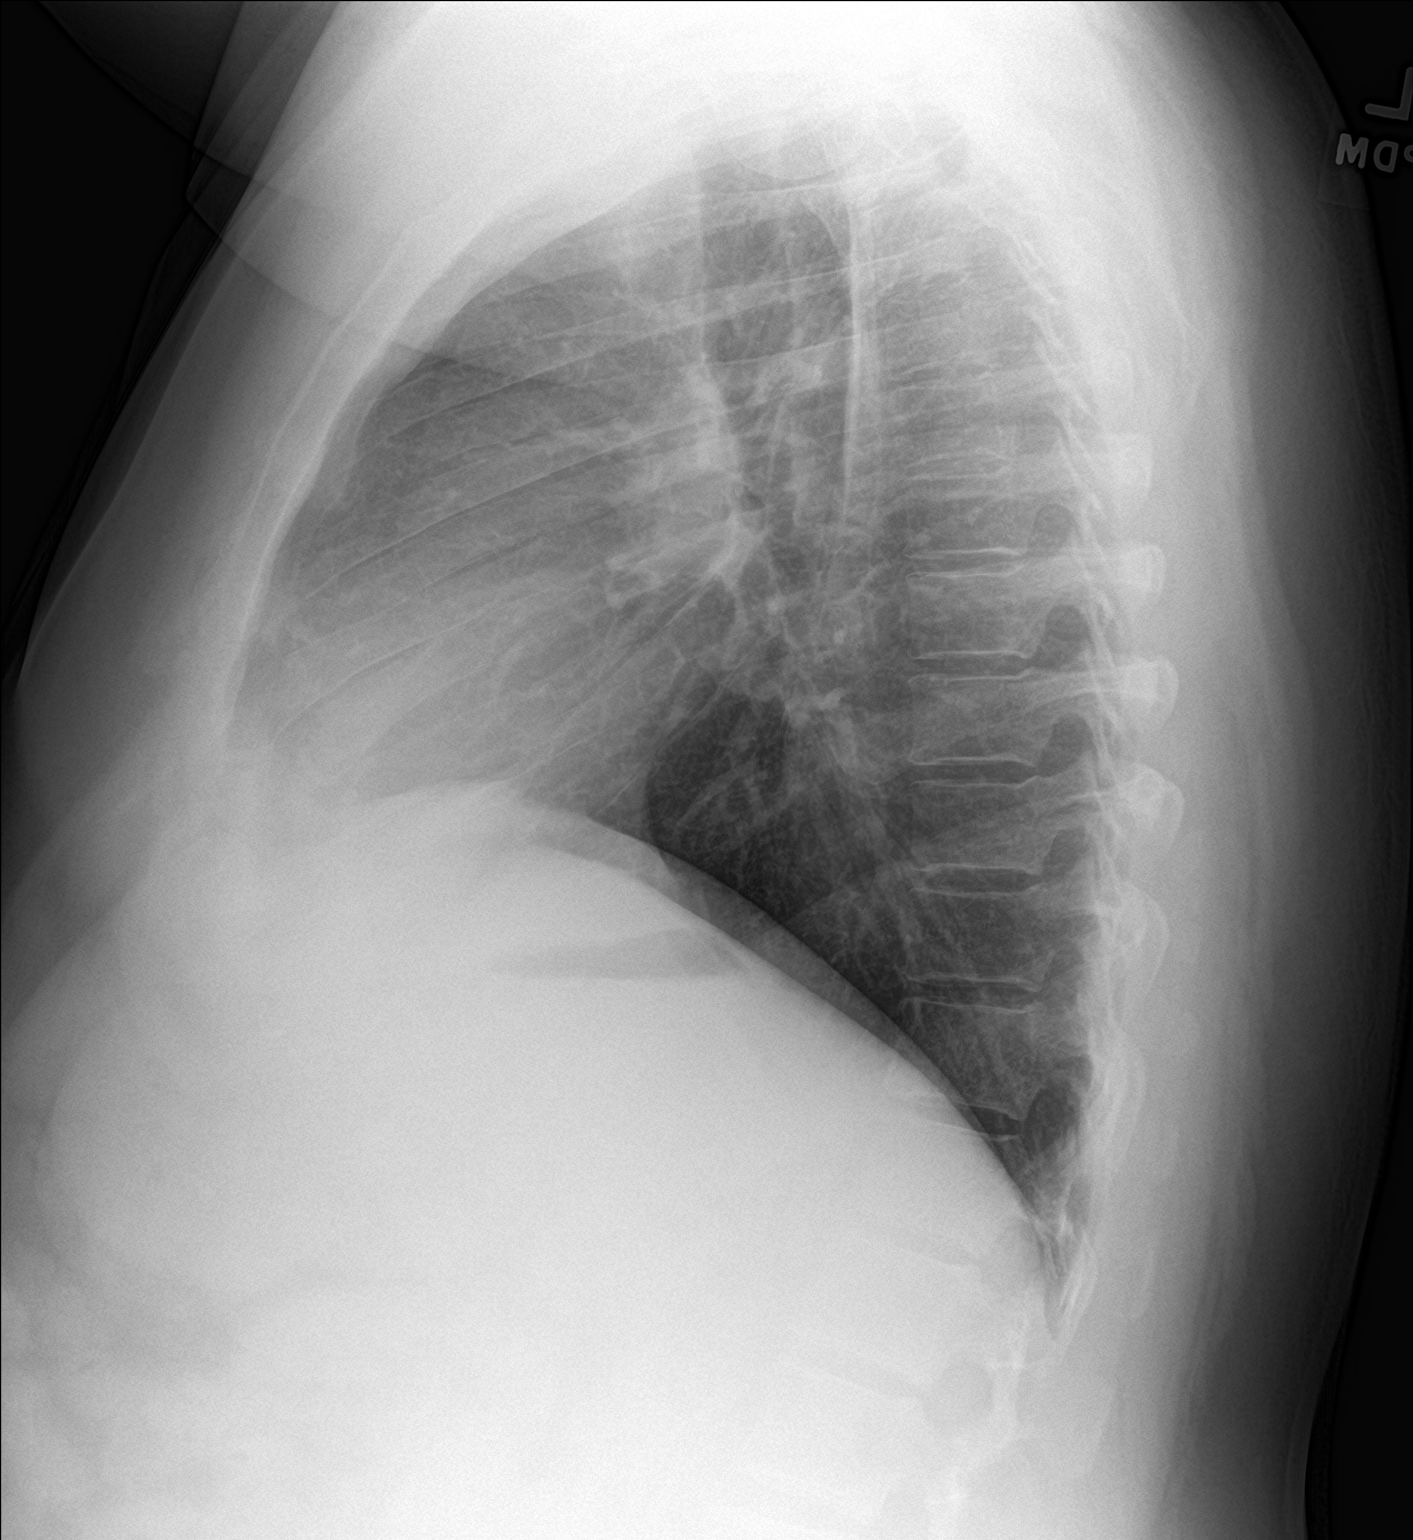

[2 of 2 positions shown; findings below may reference images not displayed]

FINDINGS: Cardiomediastinal silhouette within normal limits in size and
contour. No evidence of central vascular congestion. No interlobular
septal thickening.

No pneumothorax or pleural effusion.  No confluent airspace disease.

No acute displaced fracture. Degenerative changes of the spine.
IMPRESSION: No active cardiopulmonary disease.

## 2022-12-27 ENCOUNTER — Telehealth (INDEPENDENT_AMBULATORY_CARE_PROVIDER_SITE_OTHER): Payer: BC Managed Care – PPO | Admitting: Physician Assistant

## 2022-12-27 ENCOUNTER — Encounter: Payer: Self-pay | Admitting: Physician Assistant

## 2022-12-27 VITALS — Ht 65.0 in | Wt 245.0 lb

## 2022-12-27 DIAGNOSIS — U071 COVID-19: Secondary | ICD-10-CM

## 2022-12-27 MED ORDER — NIRMATRELVIR/RITONAVIR (PAXLOVID)TABLET
3.0000 | ORAL_TABLET | Freq: Two times a day (BID) | ORAL | 0 refills | Status: AC
Start: 2022-12-27 — End: 2023-01-01

## 2022-12-27 NOTE — Progress Notes (Signed)
   Virtual Visit via Video Note  I connected with  Cody Marks  on 12/27/22 at  1:00 PM EDT by a video enabled telemedicine application and verified that I am speaking with the correct person using two identifiers.  Location: Patient: home Provider: Nature conservation officer at [employer] Persons present: Patient and myself   I discussed the limitations of evaluation and management by telemedicine and the availability of in person appointments. The patient expressed understanding and agreed to proceed.   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   Assessment and Plan:  COVID-19 - Plan: nirmatrelvir/ritonavir (PAXLOVID) 20 x 150 MG & 10 x 100MG  TABS Diagnosis confirmed via home antigen test.  Patient is obese and diabetic, would benefit from antiviral therapy. Start Paxlovid. Risks versus benefits discussed.    Advised self-isolation at home for the next 5 days and then masking around others for at least an additional 5 days.  Treat supportively at this time including sleeping prone, deep breathing exercises, pushing fluids, walking every few hours, vitamins C and D, and Tylenol or ibuprofen as needed.  The patient understands that COVID-19 illness can wax and wane.  Should the symptoms acutely worsen or patient starts to experience sudden shortness of breath, chest  pain, severe weakness, the patient will go straight to the emergency department.  Also advised home pulse oximetry monitoring and for any reading consistently under 92%, should also report to the emergency department.  The patient will continue to keep [place] updated.   Follow Up Instructions:    I discussed the assessment and treatment plan with the patient. The patient was provided an opportunity to ask questions and all were answered. The patient agreed with the plan and demonstrated an understanding of the instructions.   The patient was advised to call back or seek an in-person evaluation if the symptoms worsen or if the condition fails to improve as anticipated.  Jamontae Thwaites M Waino Mounsey, PA-C

## 2022-12-27 NOTE — Patient Instructions (Signed)
Hold cholesterol medication while taking Paxlovid, then resume upon completion of Paxlovid

## 2023-01-05 ENCOUNTER — Ambulatory Visit (INDEPENDENT_AMBULATORY_CARE_PROVIDER_SITE_OTHER): Payer: BC Managed Care – PPO | Admitting: Gastroenterology

## 2023-01-05 ENCOUNTER — Other Ambulatory Visit (INDEPENDENT_AMBULATORY_CARE_PROVIDER_SITE_OTHER): Payer: BC Managed Care – PPO

## 2023-01-05 ENCOUNTER — Encounter: Payer: Self-pay | Admitting: Gastroenterology

## 2023-01-05 VITALS — BP 136/80 | HR 95 | Ht 65.0 in | Wt 251.0 lb

## 2023-01-05 DIAGNOSIS — D509 Iron deficiency anemia, unspecified: Secondary | ICD-10-CM | POA: Diagnosis not present

## 2023-01-05 DIAGNOSIS — R195 Other fecal abnormalities: Secondary | ICD-10-CM

## 2023-01-05 HISTORY — DX: Morbid (severe) obesity due to excess calories: E66.01

## 2023-01-05 LAB — COMPREHENSIVE METABOLIC PANEL
ALT: 26 U/L (ref 0–53)
AST: 20 U/L (ref 0–37)
Albumin: 3.8 g/dL (ref 3.5–5.2)
Alkaline Phosphatase: 79 U/L (ref 39–117)
BUN: 11 mg/dL (ref 6–23)
CO2: 27 meq/L (ref 19–32)
Calcium: 9.5 mg/dL (ref 8.4–10.5)
Chloride: 102 meq/L (ref 96–112)
Creatinine, Ser: 0.83 mg/dL (ref 0.40–1.50)
GFR: 105.92 mL/min (ref >60.00)
Glucose, Bld: 120 mg/dL — ABNORMAL HIGH (ref 70–99)
Potassium: 4 meq/L (ref 3.5–5.1)
Sodium: 138 meq/L (ref 135–145)
Total Bilirubin: 0.3 mg/dL (ref 0.2–1.2)
Total Protein: 7.7 g/dL (ref 6.0–8.3)

## 2023-01-05 LAB — CBC WITH DIFFERENTIAL/PLATELET
Basophils Absolute: 0 10*3/uL (ref 0.0–0.1)
Basophils Relative: 0.5 % (ref 0.0–3.0)
Eosinophils Absolute: 0.1 10*3/uL (ref 0.0–0.7)
Eosinophils Relative: 2 % (ref 0.0–5.0)
HCT: 38.1 % — ABNORMAL LOW (ref 39.0–52.0)
Hemoglobin: 12.3 g/dL — ABNORMAL LOW (ref 13.0–17.0)
Lymphocytes Relative: 44.8 % (ref 12.0–46.0)
Lymphs Abs: 3 10*3/uL (ref 0.7–4.0)
MCHC: 32.2 g/dL (ref 30.0–36.0)
MCV: 81.2 fl (ref 78.0–100.0)
Monocytes Absolute: 0.5 10*3/uL (ref 0.1–1.0)
Monocytes Relative: 6.8 % (ref 3.0–12.0)
Neutro Abs: 3.1 10*3/uL (ref 1.4–7.7)
Neutrophils Relative %: 45.9 % (ref 43.0–77.0)
Platelets: 259 10*3/uL (ref 150.0–400.0)
RBC: 4.7 Mil/uL (ref 4.22–5.81)
RDW: 14.4 % (ref 11.5–15.5)
WBC: 6.8 10*3/uL (ref 4.0–10.5)

## 2023-01-05 LAB — IBC + FERRITIN
Ferritin: 47.6 ng/mL (ref 22.0–322.0)
Iron: 79 ug/dL (ref 42–165)
Saturation Ratios: 21.5 % (ref 20.0–50.0)
TIBC: 366.8 ug/dL (ref 250.0–450.0)
Transferrin: 262 mg/dL (ref 212.0–360.0)

## 2023-01-05 LAB — FOLATE: Folate: 9.8 ng/mL (ref >5.9)

## 2023-01-05 LAB — VITAMIN B12: Vitamin B-12: 397 pg/mL (ref 211–911)

## 2023-01-05 MED ORDER — CLENPIQ 10-3.5-12 MG-GM -GM/175ML PO SOLN: 1.0000 | ORAL | 0 refills | Status: DC

## 2023-01-05 NOTE — Patient Instructions (Addendum)
_______________________________________________________  If your blood pressure at your visit was 140/90 or greater, please contact your primary care physician to follow up on this.  _______________________________________________________  If you are age 45 or older, your body mass index should be between 23-30. Your Body mass index is 41.77 kg/m. If this is out of the aforementioned range listed, please consider follow up with your Primary Care Provider.  If you are age 25 or younger, your body mass index should be between 19-25. Your Body mass index is 41.77 kg/m. If this is out of the aformentioned range listed, please consider follow up with your Primary Care Provider.   ________________________________________________________  The [facility] providers would like to encourage you to use [facility] to communicate with providers for non-urgent requests or questions.  Due to long hold times on the telephone, sending your provider a message by [facility] may be a faster and more efficient way to get a response.  Please allow 48 business hours for a response.  Please remember that this is for non-urgent requests.  _______________________________________________________  Your provider has requested that you go to the basement level for lab work before leaving today. Press "B" on the elevator. The lab is located at the first door on the left as you exit the elevator.  Your provider has ordered "Diatherix" stool testing for you. You have received a kit from our office today containing all necessary supplies to complete this test. Please carefully read the stool collection instructions provided in the kit before opening the accompanying materials. In addition, be sure to place the label from the top left corner of the laboratory request sheet onto the "puritan opti-swab" tube that is supplied in the kit. This label should include your full name and date of birth. After completing the test, you should secure  the purtian tube into the specimen biohazard bag. The laboratory request information sheet (including date and time of specimen collection) should be placed into the outside pocket of the specimen biohazard bag and returned to the [facility] lab with 2 days of collection.   We have sent the following medications to your pharmacy for you to pick up at your convenience: Clenpiq  You have been scheduled for an endoscopy and colonoscopy. Please follow the written instructions given to you at your visit today.  Please pick up your prep supplies at the pharmacy within the next 1-3 days.  If you use inhalers (even only as needed), please bring them with you on the day of your procedure.  DO NOT TAKE 7 DAYS PRIOR TO TEST- Trulicity (dulaglutide) Ozempic, Wegovy (semaglutide) Mounjaro (tirzepatide) Bydureon Bcise (exanatide extended release)  DO NOT TAKE 1 DAY PRIOR TO YOUR TEST Rybelsus (semaglutide) Adlyxin (lixisenatide) Victoza (liraglutide) Byetta (exanatide) ___________________________________________________________________________

## 2023-01-05 NOTE — Progress Notes (Signed)
Chief Complaint: IDA Primary GI MD: Dr. Chales Abrahams  HPI: 47 year old male with PMH of diabetes, Tuberculosis, type 2 diabetes, presents for evaluation of IDA  No previous colonoscopy/EGD  Seen by PCP 09/20/2022 with labs as below - hgb 11.8, MCV 82. - normal LFTs and renal function - TSH 1.91 -  Iron 43, sat 11%, ferritin 18 - B12 225, folate 2.5  Patient was put on oral iron one tablet daily. Reports loose stools since being put on metformin. States when his metformin was switched to ER it improved a little bit but he continues to have loose stools.  Reports 3 stools daily, watery/mud like. Denies melena/hematochezia. Denies unintentional weight loss. Reports 20lb unintentional weight gain.  Denies abdominal pain, trouble swallowing, nausea, vomiting, or heartburn.  Past Medical History:  Diagnosis Date   Tuberculosis     Past Surgical History:  Procedure Laterality Date   VASECTOMY      Current Outpatient Medications  Medication Sig Dispense Refill   atorvastatin (LIPITOR) 40 MG tablet Take 1 tablet (40 mg total) by mouth daily. 90 tablet 3   SitaGLIPtin-MetFORMIN HCl (JANUMET XR) 50-1000 MG TB24 Take 1 tablet by mouth in the morning and at bedtime. 180 tablet 3   fluticasone (FLONASE) 50 MCG/ACT nasal spray Place 2 sprays into both nostrils daily. (Patient not taking: Reported on 01/05/2023) 16 g 0   pioglitazone (ACTOS) 15 MG tablet Take 1 tablet (15 mg total) by mouth daily. (Patient not taking: Reported on 01/05/2023) 30 tablet 1   saccharomyces boulardii (FLORASTOR) 250 MG capsule Take 1 capsule (250 mg total) by mouth 2 (two) times daily. (Patient not taking: Reported on 01/05/2023) 60 capsule 11   tadalafil (CIALIS) 10 MG tablet Take 0.5-1 tablets (5-10 mg total) by mouth every other day as needed for erectile dysfunction. (Patient not taking: Reported on 01/05/2023) 30 tablet 1   No current facility-administered medications for this visit.    Allergies as of 01/05/2023    (No Known Allergies)    Family History  Problem Relation Age of Onset   COPD Mother    Diabetes Maternal Aunt    Diabetes Paternal Uncle    Colon cancer Neg Hx    Esophageal cancer Neg Hx    Stomach cancer Neg Hx     Social History   Socioeconomic History   Marital status: Married    Spouse name: Not on file   Number of children: 2   Years of education: Not on file   Highest education level: Not on file  Occupational History   Occupation: physical therapist  Tobacco Use   Smoking status: Never   Smokeless tobacco: Never  Vaping Use   Vaping status: Never Used  Substance and Sexual Activity   Alcohol use: Not on file    Comment: 1-2 a year   Drug use: Not on file   Sexual activity: Not on file  Other Topics Concern   Not on file  Social History Narrative   Not on file   Social Determinants of Health   Financial Resource Strain: Not on file  Food Insecurity: Not on file  Transportation Needs: Not on file  Physical Activity: Not on file  Stress: Not on file  Social Connections: Unknown (09/06/2021)   Received from [facility], [facility]   Social Network    Social Network: Not on file  Intimate Partner Violence: Unknown (07/30/2021)   Received from [facility], [facility]   HITS  Physically Hurt: Not on file    Insult or Talk Down To: Not on file    Threaten Physical Harm: Not on file    Scream or Curse: Not on file    Review of Systems:    Constitutional: No weight loss, fever, chills, weakness or fatigue HEENT: Eyes: No change in vision               Ears, Nose, Throat:  No change in hearing or congestion Skin: No rash or itching Cardiovascular: No chest pain, chest pressure or palpitations   Respiratory: No SOB or cough Gastrointestinal: See HPI and otherwise negative Genitourinary: No dysuria or change in urinary frequency Neurological: No headache, dizziness or syncope Musculoskeletal: No new muscle or joint pain Hematologic: No  bleeding or bruising Psychiatric: No history of depression or anxiety    Physical Exam:  Vital signs: BP 136/80   Pulse 95   Ht 5\' 5"  (1.651 m)   Wt 113.9 kg   BMI 41.77 kg/m   Constitutional: pleasant male, obese,  alert and cooperative Head:  Normocephalic and atraumatic. Eyes:   PEERL, EOMI. No icterus. Conjunctiva pink. Respiratory: Respirations even and unlabored. Lungs clear to auscultation bilaterally.   No wheezes, crackles, or rhonchi.  Cardiovascular:  Regular rate and rhythm. No peripheral edema, cyanosis or pallor.  Gastrointestinal:  Soft, nondistended, nontender. No rebound or guarding. Normal bowel sounds. No appreciable masses or hepatomegaly. Rectal:  Not performed.  Msk:  Symmetrical without gross deformities. Without edema, no deformity or joint abnormality.  Neurologic:  Alert and  oriented x4;  grossly normal neurologically.  Skin:   Dry and intact without significant lesions or rashes. Psychiatric: Oriented to person, place and time. Demonstrates good judgement and reason without abnormal affect or behaviors.   RELEVANT LABS AND IMAGING: CBC    Component Value Date/Time   WBC 6.9 09/20/2022 0834   RBC 4.45 09/20/2022 0834   HGB 11.8 (L) 09/20/2022 0834   HCT 36.8 (L) 09/20/2022 0834   PLT 235.0 09/20/2022 0834   MCV 82.7 09/20/2022 0834   MCH 26.3 06/15/2021 1049   MCHC 32.1 09/20/2022 0834   RDW 14.9 09/20/2022 0834   LYMPHSABS 2.6 06/15/2021 1049   MONOABS 0.7 06/15/2021 1049   EOSABS 0.0 06/15/2021 1049   BASOSABS 0.0 06/15/2021 1049    CMP     Component Value Date/Time   NA 138 09/20/2022 0834   K 4.8 09/20/2022 0834   CL 102 09/20/2022 0834   CO2 28 09/20/2022 0834   GLUCOSE 150 (H) 09/20/2022 0834   BUN 12 09/20/2022 0834   CREATININE 0.85 09/20/2022 0834   CALCIUM 9.0 09/20/2022 0834   PROT 7.0 09/20/2022 0834   ALBUMIN 3.7 09/20/2022 0834   AST 19 09/20/2022 0834   ALT 19 09/20/2022 0834   ALKPHOS 76 09/20/2022 0834   BILITOT  0.4 09/20/2022 0834   GFRNONAA >60 06/15/2021 1049     Assessment/Plan:   Iron deficiency anemia B12 deficiency  No overt bleeding, on one tablet iron daily. Anemia is normocytic, suspect low B12 playing a role in his low hgb as well. Iron studies do prove he is iron deficient.  - recheck CBC, CMP, Iron studies - continue iron tablet once daily, will adjust based on results as above - EGD/colonoscopy for further evaluation of IDA. - I thoroughly discussed the procedure with the patient (at bedside) to include nature of the procedure, alternatives, benefits, and risks (including but not limited to bleeding, infection, perforation,  anesthesia/cardiac pulmonary complications).  Patient verbalized understanding and gave verbal consent to proceed with procedure.  - if EGD/colonoscopy is negative and he continues to have IDA, may consider VCE. Though without overt bleeding may not be necessary.  Loose stools Started with metformin initiation. 3 watery/loose stools daily. Could be side effect from metformin, though has had abx last year due to cellulitis. Less suspicious for infectious cause but will r/o prior to colonoscopy. Fecal calprotectin and colonoscopy to r/o Crohn's  - fecal calprotectin - GI pathogen panel and C diff - Celiac serologies - TSH from June was normal - recommend using fiber daily (benefiber) - Colonoscopy for further evaluation  Elmina Hendel Jolee Ewing [facility] 01/05/2023, 11:04 AM  Cc: Ardith Dark, MD     Attending physician's note   I have taken history, reviewed the chart and examined the patient. I performed a substantive portion of this encounter, including complete performance of at least one of the key components, in conjunction with the APP. I agree with the Advanced Practitioner's note, impression and recommendations.   Diarrhea likely d/t metformin. R/O other causes. IDA/B12 deficiency. Neg CT AP 05/2021 for any obvious IBD.  No  obvious GI bleeding  Plan: -Check CBC, CMP, iron studies, celiac serology, CRP -Check stool studies for GI pathogens, C. difficile, calprotectin -Proceed with EGD/colonoscopy -Iron/B12 supplementation.   Edman Circle, MD [facility] [phone]

## 2023-01-06 ENCOUNTER — Telehealth: Payer: Self-pay

## 2023-01-06 ENCOUNTER — Ambulatory Visit (INDEPENDENT_AMBULATORY_CARE_PROVIDER_SITE_OTHER): Payer: BC Managed Care – PPO

## 2023-01-06 DIAGNOSIS — R195 Other fecal abnormalities: Secondary | ICD-10-CM

## 2023-01-06 LAB — C-REACTIVE PROTEIN: CRP: <1 mg/dL (ref 0.5–20.0)

## 2023-01-06 NOTE — Telephone Encounter (Signed)
Dr Chales Abrahams requested a CRP be added to the blood work drawn yesterday. I called the lab and added it (and faxed down add on sheet)  and will let Dr Chales Abrahams know it's been done.

## 2023-01-08 LAB — CELIAC PANEL 10
Antigliadin Abs, IgA: 5 U (ref 0–19)
Endomysial IgA: NEGATIVE
Gliadin IgG: 3 U (ref 0–19)
IgA/Immunoglobulin A, Serum: 307 mg/dL (ref 90–386)
Tissue Transglut Ab: 7 U/mL — ABNORMAL HIGH (ref 0–5)
Transglutaminase IgA: <2 U/mL (ref 0–3)

## 2023-02-16 ENCOUNTER — Encounter: Payer: Self-pay | Admitting: Gastroenterology

## 2023-03-04 ENCOUNTER — Encounter: Payer: Self-pay | Admitting: Certified Registered Nurse Anesthetist

## 2023-03-06 ENCOUNTER — Encounter: Payer: Self-pay | Admitting: Gastroenterology

## 2023-03-06 ENCOUNTER — Ambulatory Visit: Payer: BC Managed Care – PPO | Admitting: Gastroenterology

## 2023-03-06 VITALS — BP 118/76 | HR 80 | Temp 98.1°F | Resp 10 | Ht 65.0 in | Wt 251.0 lb

## 2023-03-06 DIAGNOSIS — Z1211 Encounter for screening for malignant neoplasm of colon: Secondary | ICD-10-CM

## 2023-03-06 DIAGNOSIS — K3189 Other diseases of stomach and duodenum: Secondary | ICD-10-CM | POA: Diagnosis not present

## 2023-03-06 DIAGNOSIS — K2980 Duodenitis without bleeding: Secondary | ICD-10-CM

## 2023-03-06 DIAGNOSIS — K5730 Diverticulosis of large intestine without perforation or abscess without bleeding: Secondary | ICD-10-CM

## 2023-03-06 DIAGNOSIS — D509 Iron deficiency anemia, unspecified: Secondary | ICD-10-CM

## 2023-03-06 DIAGNOSIS — R195 Other fecal abnormalities: Secondary | ICD-10-CM

## 2023-03-06 MED ORDER — SODIUM CHLORIDE 0.9 % IV SOLN: 500.0000 mL | Freq: Once | INTRAVENOUS | Status: DC

## 2023-03-06 NOTE — Op Note (Signed)
[facility] Patient Name: Cody Marks Procedure Date: 03/06/2023 10:04 AM MRN: [id number] Endoscopist: Lynann Bologna , MD, [id number] Age: 47 Referring MD:  Date of Birth: Nov 17, 1975 Gender: Male Account #: [id number] Procedure:                Colonoscopy Indications:              Screening for colorectal malignant neoplasm. H/O                            diarrhea, history of IDA. Medicines:                Monitored Anesthesia Care Procedure:                Pre-Anesthesia Assessment:                           - Prior to the procedure, a History and Physical                            was performed, and patient medications and                            allergies were reviewed. The patient's tolerance of                            previous anesthesia was also reviewed. The risks                            and benefits of the procedure and the sedation                            options and risks were discussed with the patient.                            All questions were answered, and informed consent                            was obtained. Prior Anticoagulants: The patient has                            taken no anticoagulant or antiplatelet agents. ASA                            Grade Assessment: II - A patient with mild systemic                            disease. After reviewing the risks and benefits,                            the patient was deemed in satisfactory condition to                            undergo the procedure.  After obtaining informed consent, the colonoscope                            was passed under direct vision. Throughout the                            procedure, the patient's blood pressure, pulse, and                            oxygen saturations were monitored continuously. The                            Olympus Scope SN [id number] was introduced through the                            anus and advanced to the 2  cm into the ileum. The                            colonoscopy was performed without difficulty. The                            patient tolerated the procedure well. The quality                            of the bowel preparation was good. The terminal                            ileum, ileocecal valve, appendiceal orifice, and                            rectum were photographed. Scope In: 10:52:04 AM Scope Out: 11:08:50 AM Scope Withdrawal Time: 0 hours 12 minutes 52 seconds  Total Procedure Duration: 0 hours 16 minutes 46 seconds  Findings:                 The colon (entire examined portion) appeared                            normal. Biopsies for histology were taken with a                            cold forceps from the entire colon for evaluation                            of microscopic colitis.                           A few medium-mouthed diverticula were found in the                            ascending colon and cecum.                           Non-bleeding internal hemorrhoids were found during  retroflexion. The hemorrhoids were small and Grade                            I (internal hemorrhoids that do not prolapse).                           The terminal ileum appeared normal.                           The exam was otherwise without abnormality on                            direct and retroflexion views. Complications:            No immediate complications. Estimated Blood Loss:     Estimated blood loss: none. Impression:               - Mild R colonic diverticulosis.                           - Non-bleeding internal hemorrhoids.                           - The examined portion of the ileum was normal.                           - The examination was otherwise normal on direct                            and retroflexion views. Recommendation:           - Patient has a contact number available for                            emergencies. The signs and  symptoms of potential                            delayed complications were discussed with the                            patient. Return to normal activities tomorrow.                            Written discharge instructions were provided to the                            patient.                           - Resume previous diet.                           - Continue present medications.                           - Await pathology results.                           -  Repeat colonoscopy in 10 years for screening                            purposes. Earlier, if with any new problems or                            change in family history.                           - FU in 24 weeks.                           - The findings and recommendations were discussed                            with the patient's family. Lynann Bologna, MD 03/06/2023 11:13:37 AM This report has been signed electronically.

## 2023-03-06 NOTE — Progress Notes (Signed)
1055  Pt experienced laryngeal spasm with jaw thrust performed. Nasopharyngeal airway size 8.0  placed without trauma.  Experiencing nausea and retching.  Zofran 4 mg IV given.  HR > 100 with esmolol 25 mg given IV, MD updated, vss

## 2023-03-06 NOTE — Progress Notes (Signed)
Vitals-Autumn  Pt's states no medical or surgical changes since previsit or office visit.

## 2023-03-06 NOTE — Progress Notes (Signed)
1036 Robinul 0.1 mg IV given due large amount of secretions upon assessment.  MD made aware, vss  °

## 2023-03-06 NOTE — Patient Instructions (Addendum)
Resume previous diet Continue present medications Await pathology results There were no colon polyps seen today!  You will need another screening colonoscopy in 10 years, you will receive a letter at that time when you are due for the procedure.   Please call us at [phone] if you have a change in bowel habits, change in family history of colo-rectal cancer, rectal bleeding or other GI concern before that time.  Handouts/information given for diverticulosis and hemorrhoids  YOU HAD AN ENDOSCOPIC PROCEDURE TODAY AT THE [facility]:   Refer to the procedure report that was given to you for any specific questions about what was found during the examination.  If the procedure report does not answer your questions, please call your gastroenterologist to clarify.  If you requested that your care partner not be given the details of your procedure findings, then the procedure report has been included in a sealed envelope for you to review at your convenience later.  YOU SHOULD EXPECT: Some feelings of bloating in the abdomen. Passage of more gas than usual.  Walking can help get rid of the air that was put into your GI tract during the procedure and reduce the bloating. If you had a lower endoscopy (such as a colonoscopy or flexible sigmoidoscopy) you may notice spotting of blood in your stool or on the toilet paper. If you underwent a bowel prep for your procedure, you may not have a normal bowel movement for a few days.  Please Note:  You might notice some irritation and congestion in your nose or some drainage.  This is from the oxygen used during your procedure.  There is no need for concern and it should clear up in a day or so.  SYMPTOMS TO REPORT IMMEDIATELY:  Following lower endoscopy (colonoscopy):  Excessive amounts of blood in the stool  Significant tenderness or worsening of abdominal pains  Swelling of the abdomen that is new, acute  Fever of 100F or higher Following  upper endoscopy (EGD)  Vomiting of blood or coffee ground material  New chest pain or pain under the shoulder blades  Painful or persistently difficult swallowing  New shortness of breath  Black, tarry-looking stools  For urgent or emergent issues, a gastroenterologist can be reached at any hour by calling [phone]. Do not use MyChart messaging for urgent concerns.   DIET:  We do recommend a small meal at first, but then you may proceed to your regular diet.  Drink plenty of fluids but you should avoid alcoholic beverages for 24 hours.  ACTIVITY:  You should plan to take it easy for the rest of today and you should NOT DRIVE or use heavy machinery until tomorrow (because of the sedation medicines used during the test).    FOLLOW UP: Our staff will call the number listed on your records the next business day following your procedure.  We will call around 7:15- 8:00 am to check on you and address any questions or concerns that you may have regarding the information given to you following your procedure. If we do not reach you, we will leave a message.     If any biopsies were taken you will be contacted by phone or by letter within the next 1-3 weeks.  Please call us at [phone] if you have not heard about the biopsies in 3 weeks.   SIGNATURES/CONFIDENTIALITY: You and/or your care partner have signed paperwork which will be entered into your electronic medical record.  These signatures attest to the fact that that the information above on your After Visit Summary has been reviewed and is understood.  Full responsibility of the confidentiality of this discharge information lies with you and/or your care-partner.

## 2023-03-06 NOTE — Progress Notes (Signed)
Called to room to assist during endoscopic procedure.  Patient ID and intended procedure confirmed with present staff. Received instructions for my participation in the procedure from the performing physician.  

## 2023-03-06 NOTE — Op Note (Signed)
[facility] Patient Name: Cody Marks Procedure Date: 03/06/2023 10:13 AM MRN: [id number] Endoscopist: Lynann Bologna , MD, [id number] Age: 47 Referring MD:  Date of Birth: [birth year] Gender: Male Account #: [id number] Procedure:                Upper GI endoscopy Indications:              Iron deficiency anemia. Medicines:                Monitored Anesthesia Care Procedure:                Pre-Anesthesia Assessment:                           - Prior to the procedure, a History and Physical                            was performed, and patient medications and                            allergies were reviewed. The patient's tolerance of                            previous anesthesia was also reviewed. The risks                            and benefits of the procedure and the sedation                            options and risks were discussed with the patient.                            All questions were answered, and informed consent                            was obtained. Prior Anticoagulants: The patient has                            taken no anticoagulant or antiplatelet agents. ASA                            Grade Assessment: II - A patient with mild systemic                            disease. After reviewing the risks and benefits,                            the patient was deemed in satisfactory condition to                            undergo the procedure.                           After obtaining informed consent, the endoscope was  passed under direct vision. Throughout the                            procedure, the patient's blood pressure, pulse, and                            oxygen saturations were monitored continuously. The                            Olympus Scope SN [id number] was introduced through the                            mouth, and advanced to the second part of duodenum.                            The upper GI  endoscopy was accomplished without                            difficulty. The patient tolerated the procedure                            well. Scope In: Scope Out: Findings:                 The examined esophagus was normal.                           The Z-line was regular and was found 35 cm from the                            incisors.                           The entire examined stomach was normal. Biopsies                            were taken with a cold forceps for histology.                           A single 8 mm submucosal nodule with a localized                            distribution was found in the first portion of the                            duodenum. Biopsies were taken with a cold forceps                            for histology.                           The examined duodenum was normal. Biopsies for                            histology were taken with a cold  forceps for                            evaluation of celiac disease. Complications:            No immediate complications. Estimated Blood Loss:     Estimated blood loss: none. Impression:               - Normal esophagus.                           - Z-line regular, 35 cm from the incisors.                           - Normal stomach. Biopsied.                           - Submucosal nodule found in the duodenum. Biopsied.                           - Normal examined duodenum. Biopsied. Recommendation:           - Patient has a contact number available for                            emergencies. The signs and symptoms of potential                            delayed complications were discussed with the                            patient. Return to normal activities tomorrow.                            Written discharge instructions were provided to the                            patient.                           - Resume previous diet.                           - Continue present medications.                            - Await pathology results.                           - The findings and recommendations were discussed                            with the patient's family. Lynann Bologna, MD 03/06/2023 10:49:39 AM This report has been signed electronically.

## 2023-03-06 NOTE — Progress Notes (Signed)
Chief Complaint: IDA Primary GI MD: Dr. Chales Abrahams   HPI: 47 year old male with PMH of diabetes, Tuberculosis, type 2 diabetes, presents for evaluation of IDA   No previous colonoscopy/EGD   Seen by PCP 09/20/2022 with labs as below - hgb 11.8, MCV 82. - normal LFTs and renal function - TSH 1.91 -  Iron 43, sat 11%, ferritin 18 - B12 225, folate 2.5   Patient was put on oral iron one tablet daily. Reports loose stools since being put on metformin. States when his metformin was switched to ER it improved a little bit but he continues to have loose stools.   Reports 3 stools daily, watery/mud like. Denies melena/hematochezia. Denies unintentional weight loss. Reports 20lb unintentional weight gain.   Denies abdominal pain, trouble swallowing, nausea, vomiting, or heartburn.       Past Medical History:  Diagnosis Date   Tuberculosis                 Past Surgical History:  Procedure Laterality Date   VASECTOMY                    Current Outpatient Medications  Medication Sig Dispense Refill   atorvastatin (LIPITOR) 40 MG tablet Take 1 tablet (40 mg total) by mouth daily. 90 tablet 3   SitaGLIPtin-MetFORMIN HCl (JANUMET XR) 50-1000 MG TB24 Take 1 tablet by mouth in the morning and at bedtime. 180 tablet 3   fluticasone (FLONASE) 50 MCG/ACT nasal spray Place 2 sprays into both nostrils daily. (Patient not taking: Reported on 01/05/2023) 16 g 0   pioglitazone (ACTOS) 15 MG tablet Take 1 tablet (15 mg total) by mouth daily. (Patient not taking: Reported on 01/05/2023) 30 tablet 1   saccharomyces boulardii (FLORASTOR) 250 MG capsule Take 1 capsule (250 mg total) by mouth 2 (two) times daily. (Patient not taking: Reported on 01/05/2023) 60 capsule 11   tadalafil (CIALIS) 10 MG tablet Take 0.5-1 tablets (5-10 mg total) by mouth every other day as needed for erectile dysfunction. (Patient not taking: Reported on 01/05/2023) 30 tablet 1      No current facility-administered medications  for this visit.           Allergies as of 01/05/2023   (No Known Allergies)           Family History  Problem Relation Age of Onset   COPD Mother     Diabetes Maternal Aunt     Diabetes Paternal Uncle     Colon cancer Neg Hx     Esophageal cancer Neg Hx     Stomach cancer Neg Hx            Social History         Socioeconomic History   Marital status: Married      Spouse name: Not on file   Number of children: 2   Years of education: Not on file   Highest education level: Not on file  Occupational History   Occupation: physical therapist  Tobacco Use   Smoking status: Never   Smokeless tobacco: Never  Vaping Use   Vaping status: Never Used  Substance and Sexual Activity   Alcohol use: Not on file      Comment: 1-2 a year   Drug use: Not on file   Sexual activity: Not on file  Other Topics Concern   Not on file  Social History Narrative   Not on file    Social  Determinants of Health        Financial Resource Strain: Not on file  Food Insecurity: Not on file  Transportation Needs: Not on file  Physical Activity: Not on file  Stress: Not on file  Social Connections: Unknown (09/06/2021)    Received from [facility], [facility]    Social Network     Social Network: Not on file  Intimate Partner Violence: Unknown (07/30/2021)    Received from [facility], [facility]    HITS     Physically Hurt: Not on file     Insult or Talk Down To: Not on file     Threaten Physical Harm: Not on file     Scream or Curse: Not on file      Review of Systems:    Constitutional: No weight loss, fever, chills, weakness or fatigue HEENT: Eyes: No change in vision               Ears, Nose, Throat:  No change in hearing or congestion Skin: No rash or itching Cardiovascular: No chest pain, chest pressure or palpitations   Respiratory: No SOB or cough Gastrointestinal: See HPI and otherwise negative Genitourinary: No dysuria or change in urinary  frequency Neurological: No headache, dizziness or syncope Musculoskeletal: No new muscle or joint pain Hematologic: No bleeding or bruising Psychiatric: No history of depression or anxiety      Physical Exam:  Vital signs: BP 136/80   Pulse 95   Ht 5\' 5"  (1.651 m)   Wt 113.9 kg   BMI 41.77 kg/m    Constitutional: pleasant male, obese,  alert and cooperative Head:  Normocephalic and atraumatic. Eyes:   PEERL, EOMI. No icterus. Conjunctiva pink. Respiratory: Respirations even and unlabored. Lungs clear to auscultation bilaterally.   No wheezes, crackles, or rhonchi.  Cardiovascular:  Regular rate and rhythm. No peripheral edema, cyanosis or pallor.  Gastrointestinal:  Soft, nondistended, nontender. No rebound or guarding. Normal bowel sounds. No appreciable masses or hepatomegaly. Rectal:  Not performed.  Msk:  Symmetrical without gross deformities. Without edema, no deformity or joint abnormality.  Neurologic:  Alert and  oriented x4;  grossly normal neurologically.  Skin:   Dry and intact without significant lesions or rashes. Psychiatric: Oriented to person, place and time. Demonstrates good judgement and reason without abnormal affect or behaviors.     RELEVANT LABS AND IMAGING: CBC Labs (Brief)          Component Value Date/Time    WBC 6.9 09/20/2022 0834    RBC 4.45 09/20/2022 0834    HGB 11.8 (L) 09/20/2022 0834    HCT 36.8 (L) 09/20/2022 0834    PLT 235.0 09/20/2022 0834    MCV 82.7 09/20/2022 0834    MCH 26.3 06/15/2021 1049    MCHC 32.1 09/20/2022 0834    RDW 14.9 09/20/2022 0834    LYMPHSABS 2.6 06/15/2021 1049    MONOABS 0.7 06/15/2021 1049    EOSABS 0.0 06/15/2021 1049    BASOSABS 0.0 06/15/2021 1049        CMP     Labs (Brief)          Component Value Date/Time    NA 138 09/20/2022 0834    K 4.8 09/20/2022 0834    CL 102 09/20/2022 0834    CO2 28 09/20/2022 0834    GLUCOSE 150 (H) 09/20/2022 0834    BUN 12 09/20/2022 0834    CREATININE 0.85  09/20/2022 0834    CALCIUM 9.0  09/20/2022 0834    PROT 7.0 09/20/2022 0834    ALBUMIN 3.7 09/20/2022 0834    AST 19 09/20/2022 0834    ALT 19 09/20/2022 0834    ALKPHOS 76 09/20/2022 0834    BILITOT 0.4 09/20/2022 0834    GFRNONAA >60 06/15/2021 1049          Assessment/Plan:    Iron deficiency anemia B12 deficiency  No overt bleeding, on one tablet iron daily. Anemia is normocytic, suspect low B12 playing a role in his low hgb as well. Iron studies do prove he is iron deficient.   - recheck CBC, CMP, Iron studies - continue iron tablet once daily, will adjust based on results as above - EGD/colonoscopy for further evaluation of IDA. - I thoroughly discussed the procedure with the patient (at bedside) to include nature of the procedure, alternatives, benefits, and risks (including but not limited to bleeding, infection, perforation, anesthesia/cardiac pulmonary complications).  Patient verbalized understanding and gave verbal consent to proceed with procedure.  - if EGD/colonoscopy is negative and he continues to have IDA, may consider VCE. Though without overt bleeding may not be necessary.   Loose stools Started with metformin initiation. 3 watery/loose stools daily. Could be side effect from metformin, though has had abx last year due to cellulitis. Less suspicious for infectious cause but will r/o prior to colonoscopy. Fecal calprotectin and colonoscopy to r/o Crohn's   - fecal calprotectin - GI pathogen panel and C diff - Celiac serologies - TSH from June was normal - recommend using fiber daily (benefiber) - Colonoscopy for further evaluation   Bayley Jolee Ewing [facility] 01/05/2023, 11:04 AM   Cc: Ardith Dark, MD       Attending physician's note    I have taken history, reviewed the chart and examined the patient. I performed a substantive portion of this encounter, including complete performance of at least one of the key components, in  conjunction with the APP. I agree with the Advanced Practitioner's note, impression and recommendations.    Diarrhea likely d/t metformin. R/O other causes. IDA/B12 deficiency. Neg CT [facility] 05/2021 for any obvious IBD.  No obvious GI bleeding   EGD/colon

## 2023-03-06 NOTE — Progress Notes (Signed)
Report given to PACU, vss 

## 2023-03-07 ENCOUNTER — Telehealth: Payer: Self-pay | Admitting: *Deleted

## 2023-03-07 NOTE — Telephone Encounter (Signed)
No answer for post procedure followup call. Left VM. ?

## 2023-03-08 LAB — SURGICAL PATHOLOGY

## 2023-03-09 ENCOUNTER — Other Ambulatory Visit: Payer: Self-pay | Admitting: *Deleted

## 2023-03-09 DIAGNOSIS — N529 Male erectile dysfunction, unspecified: Secondary | ICD-10-CM

## 2023-03-09 MED ORDER — ATORVASTATIN CALCIUM 40 MG PO TABS: 40.0000 mg | ORAL_TABLET | Freq: Every day | ORAL | 3 refills | Status: DC

## 2023-03-09 MED ORDER — TADALAFIL 10 MG PO TABS
5.0000 mg | ORAL_TABLET | ORAL | 1 refills | Status: DC | PRN
Start: 2023-03-09 — End: 2023-03-30

## 2023-03-16 ENCOUNTER — Encounter: Payer: Self-pay | Admitting: Gastroenterology

## 2023-03-21 ENCOUNTER — Ambulatory Visit: Payer: BC Managed Care – PPO | Admitting: Family Medicine

## 2023-03-29 ENCOUNTER — Ambulatory Visit: Payer: BC Managed Care – PPO | Admitting: Family Medicine

## 2023-03-30 ENCOUNTER — Encounter: Payer: Self-pay | Admitting: Family Medicine

## 2023-03-30 ENCOUNTER — Ambulatory Visit (INDEPENDENT_AMBULATORY_CARE_PROVIDER_SITE_OTHER): Payer: BC Managed Care – PPO | Admitting: Family Medicine

## 2023-03-30 VITALS — BP 120/80 | HR 76 | Temp 97.8°F | Ht 65.0 in | Wt 247.8 lb

## 2023-03-30 DIAGNOSIS — E1165 Type 2 diabetes mellitus with hyperglycemia: Secondary | ICD-10-CM | POA: Diagnosis not present

## 2023-03-30 DIAGNOSIS — Z23 Encounter for immunization: Secondary | ICD-10-CM

## 2023-03-30 DIAGNOSIS — E785 Hyperlipidemia, unspecified: Secondary | ICD-10-CM

## 2023-03-30 DIAGNOSIS — E1169 Type 2 diabetes mellitus with other specified complication: Secondary | ICD-10-CM

## 2023-03-30 DIAGNOSIS — Z7985 Long-term (current) use of injectable non-insulin antidiabetic drugs: Secondary | ICD-10-CM

## 2023-03-30 DIAGNOSIS — N529 Male erectile dysfunction, unspecified: Secondary | ICD-10-CM | POA: Diagnosis not present

## 2023-03-30 DIAGNOSIS — Z6841 Body Mass Index (BMI) 40.0 and over, adult: Secondary | ICD-10-CM

## 2023-03-30 DIAGNOSIS — U071 COVID-19: Secondary | ICD-10-CM

## 2023-03-30 DIAGNOSIS — Z7984 Long term (current) use of oral hypoglycemic drugs: Secondary | ICD-10-CM | POA: Diagnosis not present

## 2023-03-30 LAB — POCT GLYCOSYLATED HEMOGLOBIN (HGB A1C): Hemoglobin A1C: 8.8 % — AB (ref 4.0–5.6)

## 2023-03-30 MED ORDER — SACCHAROMYCES BOULARDII 250 MG PO CAPS: 250.0000 mg | ORAL_CAPSULE | Freq: Two times a day (BID) | ORAL | 11 refills | Status: DC

## 2023-03-30 MED ORDER — ATORVASTATIN CALCIUM 40 MG PO TABS: 40.0000 mg | ORAL_TABLET | Freq: Every day | ORAL | 3 refills | Status: AC

## 2023-03-30 MED ORDER — TADALAFIL 10 MG PO TABS: 5.0000 mg | ORAL_TABLET | ORAL | 1 refills | Status: DC | PRN

## 2023-03-30 MED ORDER — FLUTICASONE PROPIONATE 50 MCG/ACT NA SUSP: 2.0000 | Freq: Every day | NASAL | 0 refills | Status: DC

## 2023-03-30 MED ORDER — JANUMET XR 50-1000 MG PO TB24: 1.0000 | ORAL_TABLET | Freq: Two times a day (BID) | ORAL | 3 refills | Status: DC

## 2023-03-30 NOTE — Assessment & Plan Note (Signed)
Stable on tadalafil as needed.  Will refill today.

## 2023-03-30 NOTE — Assessment & Plan Note (Signed)
A1c not controlled 8.8.  He has been working on diet and exercise and has lost about 10 pounds over the last month or so.  He has been compliant with his Janumet XR 50-1000 twice daily.  He did not start the Actos.  We did discuss need for improving his glycemic control.  We did discuss alternative medication adjustments including addition of Mounjaro versus basal insulin.  We need to avoid SGLT2 inhibitors due to his prior history of perineal cellulitis and concern for Fournier's gangrene.  We did discuss with patient that the Greggory Keen is not associated with this type of infection if we need to start this.  He would like to avoid any additional medications at this point and continue to work on diet and exercise.  He has lost about 10 pounds over the last month and he will continue to work on this.  Hopefully he will have good improvement in his A1c when we recheck in 3 months.  If not improving in 3 months we will add on Mounjaro as above.

## 2023-03-30 NOTE — Patient Instructions (Signed)
It was very nice to see you today!  Your A1c is 8.8.  We need to work on getting this lower.  Return in about 6 months (around 09/28/2023) for Annual Physical.   Take care, Dr Jimmey Ralph  PLEASE NOTE:  If you had any lab tests, please let us know if you have not heard back within a few days. You may see your results on mychart before we have a chance to review them but we will give you a call once they are reviewed by [place].   If we ordered any referrals today, please let us know if you have not heard from their office within the next week.   If you had any urgent prescriptions sent in today, please check with the pharmacy within an hour of our visit to make sure the prescription was transmitted appropriately.   Please try these tips to maintain a healthy lifestyle:  Eat at least 3 REAL meals and 1-2 snacks per day.  Aim for no more than 5 hours between eating.  If you eat breakfast, please do so within one hour of getting up.   Each meal should contain half fruits/vegetables, one quarter protein, and one quarter carbs (no bigger than a computer mouse)  Cut down on sweet beverages. This includes juice, soda, and sweet tea.   Drink at least 1 glass of water with each meal and aim for at least 8 glasses per day    Exercise at least 150 minutes every week.

## 2023-03-30 NOTE — Assessment & Plan Note (Signed)
Patient down about 4 pounds since last visit on our scale however down about 10 pounds at home over the last month.  He is working on lifestyle modifications.  Recheck next office visit.

## 2023-03-30 NOTE — Assessment & Plan Note (Signed)
On Lipitor 40 mg daily.  Tolerating well.

## 2023-03-30 NOTE — Progress Notes (Signed)
   Cody Marks is a 47 y.o. male who presents today for an office visit.  Assessment/Plan:  Chronic Problems Addressed Today: Type 2 diabetes mellitus with hyperglycemia (HCC) A1c not controlled 8.8.  He has been working on diet and exercise and has lost about 10 pounds over the last month or so.  He has been compliant with his Janumet XR 50-1000 twice daily.  He did not start the Actos.  We did discuss need for improving his glycemic control.  We did discuss alternative medication adjustments including addition of Mounjaro versus basal insulin.  We need to avoid SGLT2 inhibitors due to his prior history of perineal cellulitis and concern for Fournier's gangrene.  We did discuss with patient that the Greggory Keen is not associated with this type of infection if we need to start this.  He would like to avoid any additional medications at this point and continue to work on diet and exercise.  He has lost about 10 pounds over the last month and he will continue to work on this.  Hopefully he will have good improvement in his A1c when we recheck in 3 months.  If not improving in 3 months we will add on Mounjaro as above.  Erectile dysfunction Stable on tadalafil as needed.  Will refill today.  Dyslipidemia associated with type 2 diabetes mellitus (HCC) On Lipitor 40 mg daily.  Tolerating well.  Morbid obesity (HCC) Patient down about 4 pounds since last visit on our scale however down about 10 pounds at home over the last month.  He is working on lifestyle modifications.  Recheck next office visit.     Subjective:  HPI:  See Assessment / plan for status of chronic conditions. Patient is here today for follow is here today for follow-up.  Last saw him 6 months ago.  At that time A1c was 8.0.  We started him on Actos 15 mg daily.  He was continued on Janumet XR 50-1000 twice daily.  HE did not start actos due to concern for side effects.  He has been working on diet and exercise and has lost  about 10 pounds over the last month or so.       Objective:  Physical Exam: BP 120/80   Pulse 76   Temp 97.8 F (36.6 C) (Temporal)   Ht 5\' 5"  (1.651 m)   Wt 247 lb 12.8 oz (112.4 kg)   SpO2 96%   BMI 41.24 kg/m   Wt Readings from Last 3 Encounters:  03/30/23 247 lb 12.8 oz (112.4 kg)  03/06/23 251 lb (113.9 kg)  01/05/23 251 lb (113.9 kg)    Gen: No acute distress, resting comfortably CV: Regular rate and rhythm with no murmurs appreciated Pulm: Normal work of breathing, clear to auscultation bilaterally with no crackles, wheezes, or rhonchi Neuro: Grossly normal, moves all extremities Psych: Normal affect and thought content      Nikolay Demetriou M. Jimmey Ralph, MD 03/30/2023 7:59 AM

## 2023-04-18 ENCOUNTER — Telehealth: Payer: Self-pay | Admitting: *Deleted

## 2023-04-18 NOTE — Telephone Encounter (Signed)
Copied from CRM [id number]. Topic: Clinical - Prescription Issue >> Apr 18, 2023 10:33 AM Steele Sizer wrote: Reason for CRM: Pt is requesting a call back regarding medication Janumet. Pt stated that the pharmacy is waiting on a signature.   Patient need PA for Rx Janumet Thanks

## 2023-04-20 ENCOUNTER — Telehealth: Payer: Self-pay

## 2023-04-20 ENCOUNTER — Other Ambulatory Visit (HOSPITAL_COMMUNITY): Payer: Self-pay

## 2023-04-20 NOTE — Telephone Encounter (Signed)
Pharmacy Patient Advocate Encounter   Received notification from Pt Calls Messages that prior authorization for Janumet XR 50-1000mg  is required/requested.   Insurance verification completed.   The patient is insured through [facility] .   Per test claim: PA required; PA submitted to above mentioned insurance via CoverMyMeds Key/confirmation #/EOC [id number] Status is pending

## 2023-04-20 NOTE — Telephone Encounter (Signed)
Pharmacy Patient Advocate Encounter  Received notification from [facility] that Prior Authorization for Janumet XR 50-1000mg  has been DENIED.  See denial reason below. No denial letter attached in CMM. Will attach denial letter to Media tab once received.   PA #/Case ID/Reference #: [id number]

## 2023-04-21 NOTE — Telephone Encounter (Signed)
Patient notified Rx Janumet Denied  Advise to call insurance for alternatives

## 2023-05-03 ENCOUNTER — Other Ambulatory Visit (HOSPITAL_COMMUNITY): Payer: Self-pay

## 2023-05-03 NOTE — Telephone Encounter (Signed)
 Prior Authorization Appeal requires your assistance. Please see the question below and advise accordingly.   All required information for prior authorization was submitted. The insurance denial stated: Your plan only covers this drug if it works well for you. We have denied your request because the drug did not work well for you. The patient's most recent A1C is 8.8%, indicating poor glycemic control, consistent with previous results. During the latest office visit, it was noted that the patient is not well-controlled, is monitoring diet, adding exercise, and no other therapies have been added for diabetes. Please provide the clinical rationale for the appeal.  Thank you, Devere Pandy, PharmD Clinical Pharmacist  [facility]  Direct Dial: [phone]

## 2023-05-03 NOTE — Telephone Encounter (Signed)
**Note De-identified  [place] Obfuscation** Please advise 

## 2023-05-03 NOTE — Telephone Encounter (Signed)
 Copied from CRM [id number]. Topic: Clinical - Prescription Issue >> May 02, 2023  4:56 PM Graeme ORN wrote: Reason for CRM: Patient called and states that he is still without his medication. The prior authorization has been denied. Per not, patient was asked to check for preferred alternatives. Patient states he has been taking this medication for 5 years and the prior auth was denied due to not having complete information. Patient states he would like the provider to try to continue to get his medication and submit missing information for prior auth and contact him to discuss whether or not he should take alternative. Thank You   Please can Rx Janumet  PA be resend  I spoke with patient, stated he talk to his insurance and was notified Rx was denied due to missing information, He been taking medication with no problem for the past 5 years

## 2023-05-04 ENCOUNTER — Other Ambulatory Visit: Payer: Self-pay | Admitting: Family Medicine

## 2023-05-04 DIAGNOSIS — U071 COVID-19: Secondary | ICD-10-CM

## 2023-05-04 NOTE — Telephone Encounter (Signed)
 Im not sure what else they need. Stopping his medication would be extremely detrimental to his glycemic control and overall health. Do they have listed alternatives?

## 2023-05-09 ENCOUNTER — Telehealth: Payer: Self-pay | Admitting: *Deleted

## 2023-05-09 NOTE — Telephone Encounter (Signed)
 Patient need a new PA  Thanks

## 2023-05-09 NOTE — Telephone Encounter (Signed)
 Copied from CRM [id number]. Topic: Clinical - Prescription Issue >> May 09, 2023  2:04 PM Eleanor C wrote: Reason for CRM: patient following up on messages sent in December and on 1/7 regarding prior authorization issue for his medications. He is still without his medications and he wanted to follow up with the nurse to have her give him a call regarding this issue please. Thank you   See previews note

## 2023-05-15 ENCOUNTER — Telehealth: Payer: Self-pay | Admitting: Family Medicine

## 2023-05-15 NOTE — Telephone Encounter (Signed)
Copied from CRM [id number]. Topic: Clinical - Prescription Issue >> May 15, 2023  9:23 AM [name] wrote: Reason for CRM: Pt stated that he has not been able to receive his SitaGLIPtin-MetFORMIN HCl (JANUMET XR) 50-1000 MG  medication due to it being denied. He would like a callback to proceed with trying to receive this medication.

## 2023-05-15 NOTE — Telephone Encounter (Signed)
Copied from CRM [id number]. Topic: Clinical - Prescription Issue >> May 15, 2023  3:52 PM Corin V wrote: Reason for CRM: Patient called in to speak to Dr. Lavone Neri nurse. Patient has been trying to get the prior authorization for his JANUMET XR finalized for the last week and he requested a call back before the office closes today.

## 2023-05-15 NOTE — Telephone Encounter (Signed)
Patient need a PA  Patient been taking medication for x 5 years  Controled DM with medication

## 2023-05-16 ENCOUNTER — Other Ambulatory Visit (HOSPITAL_COMMUNITY): Payer: Self-pay

## 2023-05-16 ENCOUNTER — Telehealth: Payer: Self-pay

## 2023-05-16 ENCOUNTER — Other Ambulatory Visit: Payer: Self-pay | Admitting: Family Medicine

## 2023-05-16 ENCOUNTER — Encounter: Payer: Self-pay | Admitting: Family Medicine

## 2023-05-16 MED ORDER — ZITUVIMET XR 50-1000 MG PO TB24: 2.0000 | ORAL_TABLET | Freq: Every day | ORAL | 3 refills | Status: DC

## 2023-05-16 NOTE — Telephone Encounter (Signed)
Patient send new prescription insurance card

## 2023-05-16 NOTE — Telephone Encounter (Signed)
Pharmacy Patient Advocate Encounter   Received notification from Pt Calls Messages that prior authorization for Zituvimet XR 50-1000mg  tabs is required/requested.   Insurance verification completed.   The patient is insured through [facility] .   Per test claim: PA required; PA submitted to above mentioned insurance via CoverMyMeds Key/confirmation #/EOC [id number] Status is pending

## 2023-05-16 NOTE — Telephone Encounter (Signed)
Rx PA Sent to Plan today Drug Janumet XR 50-1000MG  er tablets Waiting for determination

## 2023-05-16 NOTE — Progress Notes (Signed)
Patient seen here with his son today.  He is having trouble getting Janumet approved.  His insurance will pay for zituvimet.  Will send a send today.  He will follow-up soon for A1c check.

## 2023-05-16 NOTE — Telephone Encounter (Signed)
Pharmacy Patient Advocate Encounter  Received notification from [facility] that Prior Authorization for Zituvimet XR 50-1000mg  has been APPROVED from 05/16/23 to 05/15/26   PA #/Case ID/Reference #: [id number]  [facility] at [facility] to notify of the approval.

## 2023-05-16 NOTE — Telephone Encounter (Signed)
Card printed and given to front office to be scan in patient chart

## 2023-05-17 ENCOUNTER — Other Ambulatory Visit (HOSPITAL_COMMUNITY): Payer: Self-pay

## 2023-05-17 NOTE — Telephone Encounter (Signed)
Left voice massage to patient Rx been approved, please contact your pharmacy for refills

## 2023-05-17 NOTE — Telephone Encounter (Signed)
Patient notified

## 2023-05-17 NOTE — Telephone Encounter (Signed)
PA approved LVM to patient with complete information

## 2023-06-13 ENCOUNTER — Encounter: Payer: Self-pay | Admitting: Gastroenterology

## 2023-08-08 LAB — HM DIABETES EYE EXAM

## 2023-09-29 ENCOUNTER — Encounter: Payer: Self-pay | Admitting: Family Medicine

## 2023-09-29 ENCOUNTER — Ambulatory Visit: Payer: BC Managed Care – PPO | Admitting: Family Medicine

## 2023-09-29 VITALS — BP 113/79 | HR 77 | Temp 97.9°F | Ht 65.0 in | Wt 249.6 lb

## 2023-09-29 DIAGNOSIS — N529 Male erectile dysfunction, unspecified: Secondary | ICD-10-CM | POA: Diagnosis not present

## 2023-09-29 DIAGNOSIS — Z23 Encounter for immunization: Secondary | ICD-10-CM | POA: Diagnosis not present

## 2023-09-29 DIAGNOSIS — Z7984 Long term (current) use of oral hypoglycemic drugs: Secondary | ICD-10-CM | POA: Diagnosis not present

## 2023-09-29 DIAGNOSIS — E785 Hyperlipidemia, unspecified: Secondary | ICD-10-CM

## 2023-09-29 DIAGNOSIS — Z0000 Encounter for general adult medical examination without abnormal findings: Secondary | ICD-10-CM | POA: Diagnosis not present

## 2023-09-29 DIAGNOSIS — G4730 Sleep apnea, unspecified: Secondary | ICD-10-CM

## 2023-09-29 DIAGNOSIS — E1169 Type 2 diabetes mellitus with other specified complication: Secondary | ICD-10-CM

## 2023-09-29 DIAGNOSIS — E1165 Type 2 diabetes mellitus with hyperglycemia: Secondary | ICD-10-CM

## 2023-09-29 DIAGNOSIS — Z0001 Encounter for general adult medical examination with abnormal findings: Secondary | ICD-10-CM | POA: Diagnosis not present

## 2023-09-29 LAB — CBC WITH DIFFERENTIAL/PLATELET
Basophils Absolute: 0 10*3/uL (ref 0.0–0.1)
Basophils Relative: 0.4 % (ref 0.0–3.0)
Eosinophils Absolute: 0.1 10*3/uL (ref 0.0–0.7)
Eosinophils Relative: 1.6 % (ref 0.0–5.0)
HCT: 37.4 % — ABNORMAL LOW (ref 39.0–52.0)
Hemoglobin: 12.3 g/dL — ABNORMAL LOW (ref 13.0–17.0)
Lymphocytes Relative: 44.3 % (ref 12.0–46.0)
Lymphs Abs: 3.1 10*3/uL (ref 0.7–4.0)
MCHC: 32.8 g/dL (ref 30.0–36.0)
MCV: 80.4 fl (ref 78.0–100.0)
Monocytes Absolute: 0.4 10*3/uL (ref 0.1–1.0)
Monocytes Relative: 6.2 % (ref 3.0–12.0)
Neutro Abs: 3.3 10*3/uL (ref 1.4–7.7)
Neutrophils Relative %: 47.5 % (ref 43.0–77.0)
Platelets: 217 10*3/uL (ref 150.0–400.0)
RBC: 4.65 Mil/uL (ref 4.22–5.81)
RDW: 14.1 % (ref 11.5–15.5)
WBC: 7 10*3/uL (ref 4.0–10.5)

## 2023-09-29 LAB — LIPID PANEL
Cholesterol: 156 mg/dL (ref 0–200)
HDL: 32.5 mg/dL — ABNORMAL LOW (ref >39.00)
LDL Cholesterol: 97 mg/dL (ref 0–99)
NonHDL: 123.51
Total CHOL/HDL Ratio: 5
Triglycerides: 135 mg/dL (ref 0.0–149.0)
VLDL: 27 mg/dL (ref 0.0–40.0)

## 2023-09-29 LAB — COMPREHENSIVE METABOLIC PANEL WITH GFR
ALT: 21 U/L (ref 0–53)
AST: 14 U/L (ref 0–37)
Albumin: 3.9 g/dL (ref 3.5–5.2)
Alkaline Phosphatase: 87 U/L (ref 39–117)
BUN: 14 mg/dL (ref 6–23)
CO2: 27 meq/L (ref 19–32)
Calcium: 8.9 mg/dL (ref 8.4–10.5)
Chloride: 102 meq/L (ref 96–112)
Creatinine, Ser: 0.92 mg/dL (ref 0.40–1.50)
GFR: 100.15 mL/min (ref >60.00)
Glucose, Bld: 191 mg/dL — ABNORMAL HIGH (ref 70–99)
Potassium: 4.3 meq/L (ref 3.5–5.1)
Sodium: 136 meq/L (ref 135–145)
Total Bilirubin: 0.3 mg/dL (ref 0.2–1.2)
Total Protein: 7.2 g/dL (ref 6.0–8.3)

## 2023-09-29 LAB — MICROALBUMIN / CREATININE URINE RATIO
Creatinine,U: 160.3 mg/dL
Microalb Creat Ratio: 8 mg/g (ref 0.0–30.0)
Microalb, Ur: 1.3 mg/dL (ref 0.0–1.9)

## 2023-09-29 LAB — HEMOGLOBIN A1C: Hgb A1c MFr Bld: 10.4 % — ABNORMAL HIGH (ref 4.6–6.5)

## 2023-09-29 MED ORDER — TADALAFIL 10 MG PO TABS: 5.0000 mg | ORAL_TABLET | ORAL | 1 refills | Status: DC | PRN

## 2023-09-29 MED ORDER — DEXCOM G6 SENSOR MISC: 0 refills | Status: DC

## 2023-09-29 NOTE — Assessment & Plan Note (Signed)
 Concern for OSA given daytime somnolence.  Will place referral for sleep study.

## 2023-09-29 NOTE — Assessment & Plan Note (Signed)
 Doing well with current regimen  sitagliptin  metformin  50-1000 twice daily.  He is working on lifestyle interventions as well.  Check A1c today.  If A1c is not at goal would consider adding on Mounjaro.

## 2023-09-29 NOTE — Assessment & Plan Note (Signed)
Stable on Cialis 5 to 10 mg daily as needed.  Will refill today. 

## 2023-09-29 NOTE — Assessment & Plan Note (Signed)
 Check lipids.  Discussed lifestyle modifications.  He is on Lipitor 40 mg daily.  Does not need refill today.

## 2023-09-29 NOTE — Patient Instructions (Signed)
 It was very nice to see you today!  We will check blood work today.  I will also refer you for a sleep study  I will send a prescription in for a continuous glucose monitor.  Will get your pneumonia vaccine today.  Please continue to work on diet and exercise.  Will see back in 3 to 6 months depending on results of today's blood work.  I will see you back in 1 year for your physical.  Return in about 1 year (around 09/28/2024) for Annual Physical.   Take care, Dr Daneil Dunker  PLEASE NOTE:  If you had any lab tests, please let us  know if you have not heard back within a few days. You may see your results on mychart before we have a chance to review them but we will give you a call once they are reviewed by us .   If we ordered any referrals today, please let us  know if you have not heard from their office within the next week.   If you had any urgent prescriptions sent in today, please check with the pharmacy within an hour of our visit to make sure the prescription was transmitted appropriately.   Please try these tips to maintain a healthy lifestyle:  Eat at least 3 REAL meals and 1-2 snacks per day.  Aim for no more than 5 hours between eating.  If you eat breakfast, please do so within one hour of getting up.   Each meal should contain half fruits/vegetables, one quarter protein, and one quarter carbs (no bigger than a computer mouse)  Cut down on sweet beverages. This includes juice, soda, and sweet tea.   Drink at least 1 glass of water with each meal and aim for at least 8 glasses per day  Exercise at least 150 minutes every week.    Preventive Care 59-49 Years Old, Male Preventive care refers to lifestyle choices and visits with your health care provider that can promote health and wellness. Preventive care visits are also called wellness exams. What can I expect for my preventive care visit? Counseling During your preventive care visit, your health care provider may ask  about your: Medical history, including: Past medical problems. Family medical history. Current health, including: Emotional well-being. Home life and relationship well-being. Sexual activity. Lifestyle, including: Alcohol, nicotine or tobacco, and drug use. Access to firearms. Diet, exercise, and sleep habits. Safety issues such as seatbelt and bike helmet use. Sunscreen use. Work and work Astronomer. Physical exam Your health care provider will check your: Height and weight. These may be used to calculate your BMI (body mass index). BMI is a measurement that tells if you are at a healthy weight. Waist circumference. This measures the distance around your waistline. This measurement also tells if you are at a healthy weight and may help predict your risk of certain diseases, such as type 2 diabetes and high blood pressure. Heart rate and blood pressure. Body temperature. Skin for abnormal spots. What immunizations do I need?  Vaccines are usually given at various ages, according to a schedule. Your health care provider will recommend vaccines for you based on your age, medical history, and lifestyle or other factors, such as travel or where you work. What tests do I need? Screening Your health care provider may recommend screening tests for certain conditions. This may include: Lipid and cholesterol levels. Diabetes screening. This is done by checking your blood sugar (glucose) after you have not eaten for a while (fasting).  Hepatitis B test. Hepatitis C test. HIV (human immunodeficiency virus) test. STI (sexually transmitted infection) testing, if you are at risk. Lung cancer screening. Prostate cancer screening. Colorectal cancer screening. Talk with your health care provider about your test results, treatment options, and if necessary, the need for more tests. Follow these instructions at home: Eating and drinking  Eat a diet that includes fresh fruits and vegetables,  whole grains, lean protein, and low-fat dairy products. Take vitamin and mineral supplements as recommended by your health care provider. Do not drink alcohol if your health care provider tells you not to drink. If you drink alcohol: Limit how much you have to 0-2 drinks a day. Know how much alcohol is in your drink. In the [place] one drink equals one 12 oz bottle of beer (355 mL), one 5 oz glass of wine (148 mL), or one 1 oz glass of hard liquor (44 mL). Lifestyle Brush your teeth every morning and night with fluoride toothpaste. Floss one time each day. Exercise for at least 30 minutes 5 or more days each week. Do not use any products that contain nicotine or tobacco. These products include cigarettes, chewing tobacco, and vaping devices, such as e-cigarettes. If you need help quitting, ask your health care provider. Do not use drugs. If you are sexually active, practice safe sex. Use a condom or other form of protection to prevent STIs. Take aspirin only as told by your health care provider. Make sure that you understand how much to take and what form to take. Work with your health care provider to find out whether it is safe and beneficial for you to take aspirin daily. Find healthy ways to manage stress, such as: Meditation, yoga, or listening to music. Journaling. Talking to a trusted person. Spending time with friends and family. Minimize exposure to UV radiation to reduce your risk of skin cancer. Safety Always wear your seat belt while driving or riding in a vehicle. Do not drive: If you have been drinking alcohol. Do not ride with someone who has been drinking. When you are tired or distracted. While texting. If you have been using any mind-altering substances or drugs. Wear a helmet and other protective equipment during sports activities. If you have firearms in your house, make sure you follow all gun safety procedures. What's next? Go to your health care provider once a year  for an annual wellness visit. Ask your health care provider how often you should have your eyes and teeth checked. Stay up to date on all vaccines. This information is not intended to replace advice given to you by your health care provider. Make sure you discuss any questions you have with your health care provider. Document Revised: 10/07/2020 Document Reviewed: 10/07/2020 Elsevier Patient Education  2024 [employer].

## 2023-09-29 NOTE — Progress Notes (Signed)
 Chief Complaint:  Cody Marks is a 48 y.o. male who presents today for his annual comprehensive physical exam.    Assessment/Plan:  Chronic Problems Addressed Today: Erectile dysfunction Stable on Cialis  5 to 10 mg daily as needed.  Will refill today.  Dyslipidemia associated with type 2 diabetes mellitus (HCC) Check lipids.  Discussed lifestyle modifications.  He is on Lipitor 40 mg daily.  Does not need refill today.  Type 2 diabetes mellitus with hyperglycemia (HCC) Doing well with current regimen  sitagliptin  metformin  50-1000 twice daily.  He is working on lifestyle interventions as well.  Check A1c today.  If A1c is not at goal would consider adding on Mounjaro.  Sleep-disordered breathing Concern for OSA given daytime somnolence.  Will place referral for sleep study.   Preventative Healthcare: Check labs.  Prevnar 20 given today.  Patient Counseling(The following topics were reviewed and/or handout was given):  -Nutrition: Stressed importance of moderation in sodium/caffeine intake, saturated fat and cholesterol, caloric balance, sufficient intake of fresh fruits, vegetables, and fiber.  -Stressed the importance of regular exercise.   -Substance Abuse: Discussed cessation/primary prevention of tobacco, alcohol, or other drug use; driving or other dangerous activities under the influence; availability of treatment for abuse.   -Injury prevention: Discussed safety belts, safety helmets, smoke detector, smoking near bedding or upholstery.   -Sexuality: Discussed sexually transmitted diseases, partner selection, use of condoms, avoidance of unintended pregnancy and contraceptive alternatives.   -Dental health: Discussed importance of regular tooth brushing, flossing, and dental visits.  -Health maintenance and immunizations reviewed. Please refer to Health maintenance section.  Return to care in 1 year for next preventative visit.     Subjective:  HPI:  See A/P for  status of chronic conditions.  Patient is here today for annual physical.  He has no acute concerns however has noticed increased daytime somnolence for the last several weeks.  He has also noted that he is occasionally dozing off during the day.  This usually happens when he is completing work on tablet.  He notes that he only sleeps about 5 to 6 hours per night though does snore frequently.  She has never been checked for sleep apnea.  Lifestyle Diet: Balanced. Plenty of fruits and vegetables.  Exercise: Busy with work.      09/29/2023    7:51 AM  Depression screen PHQ 2/9  Decreased Interest 0  Down, Depressed, Hopeless 0  PHQ - 2 Score 0    Health Maintenance Due  Topic Date Due   FOOT EXAM  Never done   Diabetic kidney evaluation - Urine ACR  09/20/2023   OPHTHALMOLOGY EXAM  09/13/2023   HEMOGLOBIN A1C  09/28/2023     ROS: Per HPI, otherwise a complete review of systems was negative.   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   Erectile dysfunction 09/13/2021   Morbid obesity (HCC) 01/01/2021   Dyslipidemia associated with type 2 diabetes mellitus (HCC) 04/20/2020   Type 2 diabetes mellitus with hyperglycemia (HCC) 04/20/2020   Past Surgical History:  Procedure Laterality Date   VASECTOMY      Family History  Problem Relation Age of Onset   COPD Mother  Diabetes Maternal Aunt    Diabetes Paternal Uncle    Colon cancer Neg Hx    Esophageal cancer Neg Hx    Stomach cancer Neg Hx     Medications- reviewed and updated Current Outpatient Medications  Medication Sig Dispense Refill   atorvastatin  (LIPITOR) 40 MG tablet Take 1 tablet (40 mg total) by mouth daily. 90 tablet 3    Continuous Glucose Sensor (DEXCOM G6 SENSOR) MISC Use daily as needed to check blood sugar. 3 each 0   fluticasone  (FLONASE ) 50 MCG/ACT nasal spray SPRAY 2 SPRAYS INTO EACH NOSTRIL EVERY DAY 48 mL 1   Sitaglipt Base-Metform HCl ER (ZITUVIMET XR) 50-1000 MG TB24 Take 2 tablets by mouth daily. 90 tablet 3   tadalafil  (CIALIS ) 10 MG tablet Take 0.5-1 tablets (5-10 mg total) by mouth every other day as needed for erectile dysfunction. 30 tablet 1   No current facility-administered medications for this visit.    Allergies-reviewed and updated No Known Allergies  Social History   Socioeconomic History   Marital status: Married    Spouse name: Not on file   Number of children: 2   Years of education: Not on file   Highest education level: Master's degree (e.g., MA, MS, MEng, MEd, MSW, MBA)  Occupational History   Occupation: physical therapist  Tobacco Use   Smoking status: Never   Smokeless tobacco: Never  Vaping Use   Vaping status: Never Used  Substance and Sexual Activity   Alcohol use: Not Currently    Comment: 1-2 a year   Drug use: Never   Sexual activity: Not on file  Other Topics Concern   Not on file  Social History Narrative   Not on file   Social Drivers of Health   Financial Resource Strain: Low Risk  (03/29/2023)   Overall Financial Resource Strain (CARDIA)    Difficulty of Paying Living Expenses: Not hard at all  Food Insecurity: No Food Insecurity (03/29/2023)   Hunger Vital Sign    Worried About Running Out of Food in the Last Year: Never true    Ran Out of Food in the Last Year: Never true  Transportation Needs: No Transportation Needs (03/29/2023)   PRAPARE - Administrator, Civil Service (Medical): No    Lack of Transportation (Non-Medical): No  Physical Activity: Insufficiently Active (03/29/2023)   Exercise Vital Sign    Days of Exercise per Week: 2 days    Minutes of Exercise per Session: 30 min  Stress: No Stress Concern Present (03/29/2023)    [employer] of Occupational Health - Occupational Stress Questionnaire    Feeling of Stress : Only a little  Social Connections: Moderately Integrated (03/29/2023)   Social Connection and Isolation Panel [NHANES]    Frequency of Communication with Friends and Family: More than three times a week    Frequency of Social Gatherings with Friends and Family: Twice a week    Attends Religious Services: 1 to 4 times per year    Active Member of [employer] or Organizations: No    Attends Engineer, structural: Not on file    Marital Status: Married        Objective:  Physical Exam: BP 113/79   Pulse 77   Temp 97.9 F (36.6 C) (Temporal)   Ht 5\' 5"  (1.651 m)   Wt 249 lb 9.6 oz (113.2 kg)   SpO2 97%   BMI 41.54 kg/m   Body mass index is 41.54 kg/m. Wt Readings  from Last 3 Encounters:  09/29/23 249 lb 9.6 oz (113.2 kg)  03/30/23 247 lb 12.8 oz (112.4 kg)  03/06/23 251 lb (113.9 kg)   Gen: NAD, resting comfortably HEENT: TMs normal bilaterally. OP clear. No thyromegaly noted.  CV: RRR with no murmurs appreciated Pulm: NWOB, CTAB with no crackles, wheezes, or rhonchi GI: Normal bowel sounds present. Soft, Nontender, Nondistended. MSK: no edema, cyanosis, or clubbing noted Skin: warm, dry Neuro: CN2-12 grossly intact. Strength 5/5 in upper and lower extremities. Reflexes symmetric and intact bilaterally.  Psych: Normal affect and thought content     Sevyn Paredez M. Daneil Dunker, MD 09/29/2023 8:33 AM

## 2023-10-03 ENCOUNTER — Telehealth: Payer: Self-pay

## 2023-10-03 ENCOUNTER — Other Ambulatory Visit (HOSPITAL_COMMUNITY): Payer: Self-pay

## 2023-10-03 NOTE — Telephone Encounter (Signed)
 Pharmacy Patient Advocate Encounter   Received notification from Patient Pharmacy that prior authorization for Dexcom G6 Sensor is required/requested.   Insurance verification completed.   The patient is insured through [facility] .   Per test claim: PA required; PA submitted to above mentioned insurance via CoverMyMeds Key/confirmation #/EOC [facility] Status is pending

## 2023-10-03 NOTE — Telephone Encounter (Signed)
 Pharmacy Patient Advocate Encounter  Received notification from [facility] that Prior Authorization for Dexcom has been DENIED.  Full denial letter will be uploaded to the media tab. See denial reason below.   PA #/Case ID/Reference #: [id number]

## 2023-10-04 ENCOUNTER — Other Ambulatory Visit (HOSPITAL_COMMUNITY): Payer: Self-pay

## 2023-10-04 ENCOUNTER — Encounter: Payer: Self-pay | Admitting: Family Medicine

## 2023-10-05 ENCOUNTER — Ambulatory Visit: Payer: Self-pay | Admitting: Family Medicine

## 2023-10-05 DIAGNOSIS — D509 Iron deficiency anemia, unspecified: Secondary | ICD-10-CM | POA: Insufficient documentation

## 2023-10-05 LAB — VITAMIN B12: Vitamin B-12: 161 pg/mL — ABNORMAL LOW (ref 211–911)

## 2023-10-05 LAB — VITAMIN D 25 HYDROXY (VIT D DEFICIENCY, FRACTURES): VITD: 16.44 ng/mL — ABNORMAL LOW (ref 30.00–100.00)

## 2023-10-05 LAB — TSH: TSH: 1.96 u[IU]/mL (ref 0.35–5.50)

## 2023-10-05 NOTE — Telephone Encounter (Signed)
 Spoke with patient advise to call his insurance for alternatives on sugar monitor  Aware will call him back when lab are sign and reviewed by PCP

## 2023-10-05 NOTE — Progress Notes (Signed)
 Hi A1c is not at goal. We need to work on getting this back less than 7. As we have discussed at his previous office visits, I think we should start Mounjaro 2.5 mg weekly to improve his numbers and lower risk of complications from diabetes. Please send in new Prescription if he is agreeable or he can schedule an appointment to discuss further if he wishes.   His B12 is low. Recommend starting 1000mcg daily if he is not already doing so.  Vitamin D is low. Recommend starting 50000IU weekly. Please send in new prescription.   All of his other labs are stable.

## 2023-10-05 NOTE — Telephone Encounter (Signed)
 Patient aware  Advise to contact insurance for alternatives

## 2023-10-09 ENCOUNTER — Other Ambulatory Visit: Payer: Self-pay | Admitting: *Deleted

## 2023-10-09 ENCOUNTER — Telehealth: Payer: Self-pay | Admitting: Pharmacist

## 2023-10-09 ENCOUNTER — Other Ambulatory Visit (HOSPITAL_COMMUNITY): Payer: Self-pay

## 2023-10-09 MED ORDER — TIRZEPATIDE 2.5 MG/0.5ML ~~LOC~~ SOAJ: 2.5000 mg | SUBCUTANEOUS | 0 refills | Status: DC

## 2023-10-09 MED ORDER — VITAMIN D (ERGOCALCIFEROL) 1.25 MG (50000 UNIT) PO CAPS: 50000.0000 [IU] | ORAL_CAPSULE | ORAL | 0 refills | Status: DC

## 2023-10-09 NOTE — Telephone Encounter (Signed)
 See note

## 2023-10-09 NOTE — Telephone Encounter (Signed)
 Rx send to pharmacy

## 2023-10-09 NOTE — Telephone Encounter (Signed)
 Pharmacy Patient Advocate Encounter  Received notification from [facility] that Prior Authorization for Mounjaro 2.5MG /0.5ML auto-injectors has been APPROVED from 10/09/2023 to 10/08/2026. Ran test claim, Copay is $35.00. This test claim was processed through [facility]- copay amounts may vary at other pharmacies due to pharmacy/plan contracts, or as the patient moves through the different stages of their insurance plan.   PA #/Case ID/Reference #: Key: [id number]

## 2023-10-09 NOTE — Telephone Encounter (Signed)
 Pharmacy Patient Advocate Encounter   Received notification from Patient Pharmacy that prior authorization for Mounjaro 2.5MG /0.5ML auto-injectors is required/requested.   Insurance verification completed.   The patient is insured through [facility] .   Per test claim: PA required; PA submitted to above mentioned insurance via CoverMyMeds Key/confirmation #/EOC [facility] Status is pending

## 2023-10-09 NOTE — Telephone Encounter (Signed)
 Please send in prescription for Mounjaro 2.5 mg weekly for 4 weeks and vitamin D  50,000IU weekly. He should check back in with us  in a few weeks and we can increase the dose as tolerated.

## 2023-10-13 NOTE — Telephone Encounter (Signed)
**Note De-identified  [place] Obfuscation** Please advise 

## 2023-10-13 NOTE — Telephone Encounter (Signed)
 Yes, he should be on both medications.

## 2023-10-19 ENCOUNTER — Ambulatory Visit: Admitting: Family Medicine

## 2023-10-19 ENCOUNTER — Encounter: Payer: Self-pay | Admitting: Family Medicine

## 2023-10-19 VITALS — BP 119/77 | HR 81 | Temp 97.7°F | Ht 65.0 in | Wt 249.0 lb

## 2023-10-19 DIAGNOSIS — E1165 Type 2 diabetes mellitus with hyperglycemia: Secondary | ICD-10-CM

## 2023-10-19 DIAGNOSIS — K429 Umbilical hernia without obstruction or gangrene: Secondary | ICD-10-CM | POA: Diagnosis not present

## 2023-10-19 DIAGNOSIS — J329 Chronic sinusitis, unspecified: Secondary | ICD-10-CM | POA: Diagnosis not present

## 2023-10-19 DIAGNOSIS — Z6841 Body Mass Index (BMI) 40.0 and over, adult: Secondary | ICD-10-CM

## 2023-10-19 MED ORDER — AZITHROMYCIN 250 MG PO TABS
ORAL_TABLET | ORAL | 0 refills | Status: DC
Start: 2023-10-19 — End: 2023-12-19

## 2023-10-19 MED ORDER — BENZONATATE 200 MG PO CAPS: 200.0000 mg | ORAL_CAPSULE | Freq: Two times a day (BID) | ORAL | 0 refills | Status: DC | PRN

## 2023-10-19 NOTE — Assessment & Plan Note (Signed)
 Weight is stable since our last visit however anticipate Mounjaro will help with this.

## 2023-10-19 NOTE — Progress Notes (Signed)
   Jayke Caul is a 48 y.o. male who presents today for an office visit.  Assessment/Plan:  New/Acute Problems: Umbilical hernia No red flags.  Easily reducible on exam.  Will place referral to surgery for definitive management.  Sinusitis No red flags.  Will start Tessalon as needed for cough especially in light of above umbilical hernia.  Is okay for him to use over-the-counter meds as needed as well.  We encouraged hydration.  Will send pocket prescription for the azithromycin.  Discussed reasons to return to care.  Chronic Problems Addressed Today: Type 2 diabetes mellitus with hyperglycemia (HCC) Patient started Mounjaro 0.25 mg weekly earlier this week.  Did have some mild abdominal discomfort but has been tolerating well otherwise.  He is also continue with the sitagliptin  metformin  50-1000 twice daily.  Will recheck A1c in a few months and if A1c is at goal we will likely be able to stop his sitagliptin  at that time.  Will continue current regimen for now.  He is working on lifestyle interventions as well.  Morbid obesity (HCC) Weight is stable since our last visit however anticipate Mounjaro will help with this.     Subjective:  HPI:  See assessment / plan for status of chronic conditions.  Patient here today with concern for abdominal hernia.  First noticed it 6 days ago.  He has been dealing with URI and is having a lot of cough and congestion and believes this may have caused it.  He has noticed a small soft lump in his umbilicus.  Some pain and discomfort in the area.  Also seems like it is increasing last few days.  His cough and congestion have been persistent last few days as well.  Tried using over-the-counter Robitussin with some improvement.  No reported fevers or chills.        Objective:  Physical Exam: BP 119/77   Pulse 81   Temp 97.7 F (36.5 C) (Temporal)   Ht 5' 5 (1.651 m)   Wt 249 lb (112.9 kg)   SpO2 98%   BMI 41.44 kg/m   Wt Readings  from Last 3 Encounters:  10/19/23 249 lb (112.9 kg)  09/29/23 249 lb 9.6 oz (113.2 kg)  03/30/23 247 lb 12.8 oz (112.4 kg)    Gen: No acute distress, resting comfortably HEENT: TMs clear.  OP clear. CV: Regular rate and rhythm with no murmurs appreciated Pulm: Normal work of breathing, clear to auscultation bilaterally with no crackles, wheezes, or rhonchi Abdomen: Easily reducible approximately 1 cm umbilical hernia noted. Neuro: Grossly normal, moves all extremities Psych: Normal affect and thought content      Jojo Pehl M. Kennyth, MD 10/19/2023 8:39 AM

## 2023-10-19 NOTE — Patient Instructions (Addendum)
 It was very nice to see you today!  You have an umbilical hernia.  I will refer you to see the surgeon.  Please try the cough medication.  Start the antibiotic if not improving in a few days.  Return if symptoms worsen or fail to improve.   Take care, Dr Kennyth  PLEASE NOTE:  If you had any lab tests, please let us  know if you have not heard back within a few days. You may see your results on mychart before we have a chance to review them but we will give you a call once they are reviewed by us .   If we ordered any referrals today, please let us  know if you have not heard from their office within the next week.   If you had any urgent prescriptions sent in today, please check with the pharmacy within an hour of our visit to make sure the prescription was transmitted appropriately.   Please try these tips to maintain a healthy lifestyle:  Eat at least 3 REAL meals and 1-2 snacks per day.  Aim for no more than 5 hours between eating.  If you eat breakfast, please do so within one hour of getting up.   Each meal should contain half fruits/vegetables, one quarter protein, and one quarter carbs (no bigger than a computer mouse)  Cut down on sweet beverages. This includes juice, soda, and sweet tea.   Drink at least 1 glass of water with each meal and aim for at least 8 glasses per day  Exercise at least 150 minutes every week.

## 2023-10-19 NOTE — Assessment & Plan Note (Signed)
 Patient started Mounjaro 0.25 mg weekly earlier this week.  Did have some mild abdominal discomfort but has been tolerating well otherwise.  He is also continue with the sitagliptin  metformin  50-1000 twice daily.  Will recheck A1c in a few months and if A1c is at goal we will likely be able to stop his sitagliptin  at that time.  Will continue current regimen for now.  He is working on lifestyle interventions as well.

## 2023-11-08 ENCOUNTER — Ambulatory Visit: Payer: Self-pay | Admitting: General Surgery

## 2023-11-11 ENCOUNTER — Other Ambulatory Visit: Payer: Self-pay | Admitting: Family Medicine

## 2023-12-11 ENCOUNTER — Encounter: Payer: Self-pay | Admitting: Family Medicine

## 2023-12-19 ENCOUNTER — Encounter: Payer: Self-pay | Admitting: Family Medicine

## 2023-12-19 ENCOUNTER — Other Ambulatory Visit: Payer: Self-pay | Admitting: Family Medicine

## 2023-12-19 NOTE — Progress Notes (Signed)
 Surgical Instructions   Your procedure is scheduled on December 26, 2023. Report to [facility] Main Entrance A at 10:30 A.M., then check in with the Admitting office. Any questions or running late day of surgery: call [phone]  Questions prior to your surgery date: call [phone], Monday-Friday, 8am-4pm. If you experience any cold or flu symptoms such as cough, fever, chills, shortness of breath, etc. between now and your scheduled surgery, please notify us  at the above number.     Remember:  Do not eat after midnight the night before your surgery   You may drink clear liquids until 9:30 the morning of your surgery.   Clear liquids allowed are: Water, Non-Citrus Juices (without pulp), Carbonated Beverages, Clear Tea (no milk, honey, etc.), Black Coffee Only (NO MILK, CREAM OR POWDERED CREAMER of any kind), and Gatorade.    Take these medicines the morning of surgery with A SIP OF WATER  atorvastatin  (LIPITOR)  fluticasone  (FLONASE )   May take these medicines IF NEEDED: benzonatate  (TESSALON )    One week prior to surgery, STOP taking any Aspirin (unless otherwise instructed by your surgeon) Aleve, Naproxen, Ibuprofen, Motrin, Advil, Goody's, BC's, all herbal medications, fish oil, and non-prescription vitamins. WHAT DO I DO ABOUT MY DIABETES MEDICATION?   Do not take oral diabetes medicines sitaGLIPtin -metformin  (JANUMET )  the morning of surgery.         tirzepatide  (MOUNJARO ) LAST DOSE   The day of surgery, do not take other diabetes injectables, including Byetta (exenatide), Bydureon (exenatide ER), Victoza (liraglutide), or Trulicity (dulaglutide).  If your CBG is greater than 220 mg/dL, you may take  of your sliding scale (correction) dose of insulin.   HOW TO MANAGE YOUR DIABETES BEFORE AND AFTER SURGERY  Why is it important to control my blood sugar before and after surgery? Improving blood sugar levels before and after surgery helps healing and can limit  problems. A way of improving blood sugar control is eating a healthy diet by:  Eating less sugar and carbohydrates  Increasing activity/exercise  Talking with your doctor about reaching your blood sugar goals High blood sugars (greater than 180 mg/dL) can raise your risk of infections and slow your recovery, so you will need to focus on controlling your diabetes during the weeks before surgery. Make sure that the doctor who takes care of your diabetes knows about your planned surgery including the date and location.  How do I manage my blood sugar before surgery? Check your blood sugar at least 4 times a day, starting 2 days before surgery, to make sure that the level is not too high or low.  Check your blood sugar the morning of your surgery when you wake up and every 2 hours until you get to the Short Stay unit.  If your blood sugar is less than 70 mg/dL, you will need to treat for low blood sugar: Do not take insulin. Treat a low blood sugar (less than 70 mg/dL) with  cup of clear juice (cranberry or apple), 4 glucose tablets, OR glucose gel. Recheck blood sugar in 15 minutes after treatment (to make sure it is greater than 70 mg/dL). If your blood sugar is not greater than 70 mg/dL on recheck, call [phone] for further instructions. Report your blood sugar to the short stay nurse when you get to Short Stay.  If you are admitted to the hospital after surgery: Your blood sugar will be checked by the staff and you will probably be given insulin after surgery (instead  of oral diabetes medicines) to make sure you have good blood sugar levels. The goal for blood sugar control after surgery is 80-180 mg/dL.                     Do NOT Smoke (Tobacco/Vaping) for 24 hours prior to your procedure.  If you use a CPAP at night, you may bring your mask/headgear for your overnight stay.   You will be asked to remove any contacts, glasses, piercing's, hearing aid's, dentures/partials prior to  surgery. Please bring cases for these items if needed.    Patients discharged the day of surgery will not be allowed to drive home, and someone needs to stay with them for 24 hours.  SURGICAL WAITING ROOM VISITATION Patients may have no more than 2 support people in the waiting area - these visitors may rotate.   Pre-op nurse will coordinate an appropriate time for 1 ADULT support person, who may not rotate, to accompany patient in pre-op.  Children under the age of 59 must have an adult with them who is not the patient and must remain in the main waiting area with an adult.  If the patient needs to stay at the hospital during part of their recovery, the visitor guidelines for inpatient rooms apply.  Please refer to the [facility] website for the visitor guidelines for any additional information.   If you received a COVID test during your pre-op visit  it is requested that you wear a mask when out in public, stay away from anyone that may not be feeling well and notify your surgeon if you develop symptoms. If you have been in contact with anyone that has tested positive in the last 10 days please notify you surgeon.      Pre-operative CHG Bathing Instructions   You can play a key role in reducing the risk of infection after surgery. Your skin needs to be as free of germs as possible. You can reduce the number of germs on your skin by washing with CHG (chlorhexidine  gluconate) soap before surgery. CHG is an antiseptic soap that kills germs and continues to kill germs even after washing.   DO NOT use if you have an allergy to chlorhexidine /CHG or antibacterial soaps. If your skin becomes reddened or irritated, stop using the CHG and notify one of our RNs at [phone].              TAKE A SHOWER THE NIGHT BEFORE SURGERY AND THE DAY OF SURGERY    Please keep in mind the following:  DO NOT shave, including legs and underarms, 48 hours prior to surgery.   You may shave your face before/day  of surgery.  Place clean sheets on your bed the night before surgery Use a clean washcloth (not used since being washed) for each shower. DO NOT sleep with pet's night before surgery.  CHG Shower Instructions:  Wash your face and private area with normal soap. If you choose to wash your hair, wash first with your normal shampoo.  After you use shampoo/soap, rinse your hair and body thoroughly to remove shampoo/soap residue.  Turn the water OFF and apply half the bottle of CHG soap to a CLEAN washcloth.  Apply CHG soap ONLY FROM YOUR NECK DOWN TO YOUR TOES (washing for 3-5 minutes)  DO NOT use CHG soap on face, private areas, open wounds, or sores.  Pay special attention to the area where your surgery is being performed.  If  you are having back surgery, having someone wash your back for you may be helpful. Wait 2 minutes after CHG soap is applied, then you may rinse off the CHG soap.  Pat dry with a clean towel  Put on clean pajamas    Additional instructions for the day of surgery: DO NOT APPLY any lotions, deodorants, cologne, or perfumes.   Do not wear jewelry or makeup Do not wear nail polish, gel polish, artificial nails, or any other type of covering on natural nails (fingers and toes) Do not bring valuables to the hospital. [facility] is not responsible for valuables/personal belongings. Put on clean/comfortable clothes.  Please brush your teeth.  Ask your nurse before applying any prescription medications to the skin.

## 2023-12-20 ENCOUNTER — Encounter (HOSPITAL_COMMUNITY): Payer: Self-pay

## 2023-12-20 ENCOUNTER — Other Ambulatory Visit: Payer: Self-pay

## 2023-12-20 ENCOUNTER — Encounter (HOSPITAL_COMMUNITY)
Admission: RE | Admit: 2023-12-20 | Discharge: 2023-12-20 | Disposition: A | Discharge status: Home / Self Care | Source: Ambulatory Visit | Attending: General Surgery | Admitting: General Surgery

## 2023-12-20 ENCOUNTER — Other Ambulatory Visit: Payer: Self-pay | Admitting: *Deleted

## 2023-12-20 VITALS — BP 123/77 | HR 77 | Temp 98.4°F | Resp 17 | Ht 65.0 in | Wt 245.4 lb

## 2023-12-20 DIAGNOSIS — Z01818 Encounter for other preprocedural examination: Secondary | ICD-10-CM | POA: Insufficient documentation

## 2023-12-20 DIAGNOSIS — E1165 Type 2 diabetes mellitus with hyperglycemia: Secondary | ICD-10-CM | POA: Diagnosis not present

## 2023-12-20 HISTORY — DX: Anemia, unspecified: D64.9

## 2023-12-20 HISTORY — DX: Type 2 diabetes mellitus without complications: E11.9

## 2023-12-20 LAB — CBC
HCT: 39.2 % (ref 39.0–52.0)
Hemoglobin: 12.5 g/dL — ABNORMAL LOW (ref 13.0–17.0)
MCH: 26.4 pg (ref 26.0–34.0)
MCHC: 31.9 g/dL (ref 30.0–36.0)
MCV: 82.7 fL (ref 80.0–100.0)
Platelets: 260 K/uL (ref 150–400)
RBC: 4.74 MIL/uL (ref 4.22–5.81)
RDW: 13.6 % (ref 11.5–15.5)
WBC: 6.6 K/uL (ref 4.0–10.5)
nRBC: 0 % (ref 0.0–0.2)

## 2023-12-20 LAB — BASIC METABOLIC PANEL WITH GFR
Anion gap: 9 (ref 5–15)
BUN: 14 mg/dL (ref 6–20)
CO2: 22 mmol/L (ref 22–32)
Calcium: 9.2 mg/dL (ref 8.9–10.3)
Chloride: 104 mmol/L (ref 98–111)
Creatinine, Ser: 0.85 mg/dL (ref 0.61–1.24)
GFR, Estimated: >60 mL/min (ref >60)
Glucose, Bld: 213 mg/dL — ABNORMAL HIGH (ref 70–99)
Potassium: 4.5 mmol/L (ref 3.5–5.1)
Sodium: 135 mmol/L (ref 135–145)

## 2023-12-20 LAB — GLUCOSE, CAPILLARY: Glucose-Capillary: 208 mg/dL — ABNORMAL HIGH (ref 70–99)

## 2023-12-20 NOTE — Progress Notes (Addendum)
 Surgical Instructions     Your procedure is scheduled on December 26, 2023. Report to [facility] Main Entrance A at 5:30 A.M., then check in with the Admitting office. Any questions or running late day of surgery: call [phone]   Questions prior to your surgery date: call [phone], Monday-Friday, 8am-4pm. If you experience any cold or flu symptoms such as cough, fever, chills, shortness of breath, etc. between now and your scheduled surgery, please notify us  at the above number.            Remember:       Do not eat after midnight the night before your surgery     You may drink clear liquids until 4:30 the morning of your surgery.   Clear liquids allowed are: Water, Non-Citrus Juices (without pulp), Carbonated Beverages, Clear Tea (no milk, honey, etc.), Black Coffee Only (NO MILK, CREAM OR POWDERED CREAMER of any kind), and Gatorade.          Take these medicines the morning of surgery with A SIP OF WATER: Atorvastatin  (LIPITOR)    May take these medicines IF NEEDED: fluticasone  (FLONASE )   One week prior to surgery, STOP taking any Aspirin (unless otherwise instructed by your surgeon) Aleve, Naproxen, Ibuprofen, Motrin, Advil, Goody's, BC's, all herbal medications, fish oil, and non-prescription vitamins.   WHAT DO I DO ABOUT MY DIABETES MEDICATION?     HOLD your diabetes medication tirzepatide  (MOUNJARO ) for 7 day's prior to your surgery.  LAST DOSE: Monday, August 25th  Do not take oral diabetes medicines ZITUVIMET  XR the morning of surgery. LAST DOSE: Monday September 1th in the evening.   The day of surgery, do not take other diabetes injectables, including Byetta (exenatide), Bydureon (exenatide ER), Victoza (liraglutide), or Trulicity (dulaglutide).   If your CBG is greater than 220 mg/dL, you may take  of your sliding scale (correction) dose of insulin.     HOW TO MANAGE YOUR DIABETES BEFORE AND AFTER SURGERY   Why is it important to control my blood  sugar before and after surgery? Improving blood sugar levels before and after surgery helps healing and can limit problems. A way of improving blood sugar control is eating a healthy diet by:  Eating less sugar and carbohydrates  Increasing activity/exercise  Talking with your doctor about reaching your blood sugar goals High blood sugars (greater than 180 mg/dL) can raise your risk of infections and slow your recovery, so you will need to focus on controlling your diabetes during the weeks before surgery. Make sure that the doctor who takes care of your diabetes knows about your planned surgery including the date and location.   How do I manage my blood sugar before surgery? Check your blood sugar at least 4 times a day, starting 2 days before surgery, to make sure that the level is not too high or low.   Check your blood sugar the morning of your surgery when you wake up and every 2 hours until you get to the Short Stay unit.   If your blood sugar is less than 70 mg/dL, you will need to treat for low blood sugar: Do not take insulin. Treat a low blood sugar (less than 70 mg/dL) with  cup of clear juice (cranberry or apple), 4 glucose tablets, OR glucose gel. Recheck blood sugar in 15 minutes after treatment (to make sure it is greater than 70 mg/dL). If your blood sugar is not greater than 70 mg/dL on recheck, call [phone] for further  instructions. Report your blood sugar to the short stay nurse when you get to Short Stay.   If you are admitted to the hospital after surgery: Your blood sugar will be checked by the staff and you will probably be given insulin after surgery (instead of oral diabetes medicines) to make sure you have good blood sugar levels. The goal for blood sugar control after surgery is 80-180 mg/dL.                     Do NOT Smoke (Tobacco/Vaping) for 24 hours prior to your procedure.   If you use a CPAP at night, you may bring your mask/headgear for your  overnight stay.   You will be asked to remove any contacts, glasses, piercing's, hearing aid's, dentures/partials prior to surgery. Please bring cases for these items if needed.    Patients discharged the day of surgery will not be allowed to drive home, and someone needs to stay with them for 24 hours.   SURGICAL WAITING ROOM VISITATION Patients may have no more than 2 support people in the waiting area - these visitors may rotate.   Pre-op nurse will coordinate an appropriate time for 1 ADULT support person, who may not rotate, to accompany patient in pre-op.  Children under the age of 42 must have an adult with them who is not the patient and must remain in the main waiting area with an adult.   If the patient needs to stay at the hospital during part of their recovery, the visitor guidelines for inpatient rooms apply.   Please refer to the [facility] website for the visitor guidelines for any additional information.     If you received a COVID test during your pre-op visit  it is requested that you wear a mask when out in public, stay away from anyone that may not be feeling well and notify your surgeon if you develop symptoms. If you have been in contact with anyone that has tested positive in the last 10 days please notify you surgeon.         Pre-operative CHG Bathing Instructions    You can play a key role in reducing the risk of infection after surgery. Your skin needs to be as free of germs as possible. You can reduce the number of germs on your skin by washing with CHG (chlorhexidine  gluconate) soap before surgery. CHG is an antiseptic soap that kills germs and continues to kill germs even after washing.    DO NOT use if you have an allergy to chlorhexidine /CHG or antibacterial soaps. If your skin becomes reddened or irritated, stop using the CHG and notify one of our RNs at [phone].               TAKE A SHOWER THE NIGHT BEFORE SURGERY AND THE DAY OF SURGERY     Please  keep in mind the following:  DO NOT shave, including legs and underarms, 48 hours prior to surgery.   You may shave your face before/day of surgery.  Place clean sheets on your bed the night before surgery Use a clean washcloth (not used since being washed) for each shower. DO NOT sleep with pet's night before surgery.   CHG Shower Instructions:  Wash your face and private area with normal soap. If you choose to wash your hair, wash first with your normal shampoo.  After you use shampoo/soap, rinse your hair and body thoroughly to remove shampoo/soap residue.  Turn  the water OFF and apply half the bottle of CHG soap to a CLEAN washcloth.  Apply CHG soap ONLY FROM YOUR NECK DOWN TO YOUR TOES (washing for 3-5 minutes)  DO NOT use CHG soap on face, private areas, open wounds, or sores.  Pay special attention to the area where your surgery is being performed.  If you are having back surgery, having someone wash your back for you may be helpful. Wait 2 minutes after CHG soap is applied, then you may rinse off the CHG soap.  Pat dry with a clean towel  Put on clean pajamas     Additional instructions for the day of surgery: DO NOT APPLY any lotions, deodorants, cologne, or perfumes.   Do not wear jewelry or makeup Do not wear nail polish, gel polish, artificial nails, or any other type of covering on natural nails (fingers and toes) Do not bring valuables to the hospital. [facility] is not responsible for valuables/personal belongings. Put on clean/comfortable clothes.  Please brush your teeth.  Ask your nurse before applying any prescription medications to the skin.

## 2023-12-20 NOTE — Progress Notes (Signed)
 PCP - Dr. Worth Kitty Cardiologist - Denies  PPM/ICD - Denies Device Orders - n/a Rep Notified - n/a  Chest x-ray - n/a EKG - 12-20-23. Stress Test - Denies ECHO - Denies Cardiac Cath - Denies  Sleep Study - Denies - in process of scheduling one with his PCP CPAP - n/a  Fasting Blood Sugar - 140's Checks Blood Sugar: Weekly; could not tolerate the Dexcom so he uses traditional finger stick.  Last dose of GLP1 agonist-  Mounjaro  (Tirzepatide ) GLP1 instructions: Last dose taken on Monday, August 18th.  Last dose should be on August 25th however patient ran out of it and pharmacy is out of stock.  Blood Thinner Instructions: Denies Aspirin Instructions: Denies  ERAS Protcol - Clears until 0430 PRE-SURGERY Ensure or G2- n/a  COVID TEST- N/A   Anesthesia review: Yes, DM  Patient denies shortness of breath, fever, cough and chest pain at PAT appointment. Patient denies any respiratory issues at this time.    All instructions explained to the patient, with a verbal understanding of the material. Patient agrees to go over the instructions while at home for a better understanding. Patient also instructed to self quarantine after being tested for COVID-19. The opportunity to ask questions was provided.

## 2023-12-21 LAB — HEMOGLOBIN A1C
Hgb A1c MFr Bld: 9.5 % — ABNORMAL HIGH (ref 4.8–5.6)
Mean Plasma Glucose: 226 mg/dL

## 2023-12-21 MED ORDER — MOUNJARO 2.5 MG/0.5ML ~~LOC~~ SOAJ: 2.5000 mg | SUBCUTANEOUS | 0 refills | Status: DC

## 2023-12-25 NOTE — Anesthesia Preprocedure Evaluation (Signed)
 Anesthesia Evaluation  Patient identified by MRN, date of birth, ID band Patient awake    Reviewed: Allergy & Precautions, H&P , NPO status , Patient's Chart, lab work & pertinent test results  History of Anesthesia Complications Negative for: history of anesthetic complications  Airway Mallampati: II  TM Distance: >3 FB Neck ROM: Full    Dental no notable dental hx.    Pulmonary  Hx of TB   Pulmonary exam normal breath sounds clear to auscultation       Cardiovascular (-) angina (-) Past MI negative cardio ROS Normal cardiovascular exam Rhythm:Regular Rate:Normal     Neuro/Psych negative neurological ROS  negative psych ROS   GI/Hepatic negative GI ROS, Neg liver ROS,,,  Endo/Other  diabetes, Type 2    Renal/GU negative Renal ROS  negative genitourinary   Musculoskeletal negative musculoskeletal ROS (+)    Abdominal   Peds negative pediatric ROS (+)  Hematology  (+) Blood dyscrasia, anemia   Anesthesia Other Findings   Reproductive/Obstetrics negative OB ROS                              Anesthesia Physical Anesthesia Plan  ASA: 2  Anesthesia Plan: General   Post-op Pain Management: Tylenol  PO (pre-op)* and Celebrex  PO (pre-op)*   Induction: Intravenous  PONV Risk Score and Plan: 2 and Ondansetron , Dexamethasone , Midazolam  and Treatment may vary due to age or medical condition  Airway Management Planned: Oral ETT  Additional Equipment: None  Intra-op Plan:   Post-operative Plan: Extubation in OR  Informed Consent: I have reviewed the patients History and Physical, chart, labs and discussed the procedure including the risks, benefits and alternatives for the proposed anesthesia with the patient or authorized representative who has indicated his/her understanding and acceptance.     Dental advisory given  Plan Discussed with: CRNA  Anesthesia Plan Comments:           Anesthesia Quick Evaluation

## 2023-12-26 ENCOUNTER — Other Ambulatory Visit: Payer: Self-pay

## 2023-12-26 ENCOUNTER — Ambulatory Visit (HOSPITAL_COMMUNITY)
Admission: RE | Admit: 2023-12-26 | Discharge: 2023-12-26 | Disposition: A | Discharge status: Home / Self Care | Attending: General Surgery | Admitting: General Surgery

## 2023-12-26 ENCOUNTER — Ambulatory Visit (HOSPITAL_COMMUNITY): Payer: Self-pay

## 2023-12-26 ENCOUNTER — Ambulatory Visit (HOSPITAL_COMMUNITY): Payer: Self-pay | Admitting: Physician Assistant

## 2023-12-26 ENCOUNTER — Encounter (HOSPITAL_COMMUNITY)
Admission: RE | Disposition: A | Discharge status: Home / Self Care | Payer: Self-pay | Source: Home / Self Care | Attending: General Surgery

## 2023-12-26 ENCOUNTER — Encounter (HOSPITAL_COMMUNITY): Payer: Self-pay | Admitting: General Surgery

## 2023-12-26 DIAGNOSIS — Z6841 Body Mass Index (BMI) 40.0 and over, adult: Secondary | ICD-10-CM | POA: Diagnosis not present

## 2023-12-26 DIAGNOSIS — Z8611 Personal history of tuberculosis: Secondary | ICD-10-CM | POA: Insufficient documentation

## 2023-12-26 DIAGNOSIS — Z833 Family history of diabetes mellitus: Secondary | ICD-10-CM | POA: Diagnosis not present

## 2023-12-26 DIAGNOSIS — Z7985 Long-term (current) use of injectable non-insulin antidiabetic drugs: Secondary | ICD-10-CM | POA: Insufficient documentation

## 2023-12-26 DIAGNOSIS — E119 Type 2 diabetes mellitus without complications: Secondary | ICD-10-CM | POA: Insufficient documentation

## 2023-12-26 DIAGNOSIS — K439 Ventral hernia without obstruction or gangrene: Secondary | ICD-10-CM | POA: Diagnosis not present

## 2023-12-26 DIAGNOSIS — Z79899 Other long term (current) drug therapy: Secondary | ICD-10-CM | POA: Diagnosis not present

## 2023-12-26 DIAGNOSIS — K429 Umbilical hernia without obstruction or gangrene: Secondary | ICD-10-CM | POA: Diagnosis present

## 2023-12-26 DIAGNOSIS — E1165 Type 2 diabetes mellitus with hyperglycemia: Secondary | ICD-10-CM

## 2023-12-26 HISTORY — PX: XI ROBOTIC ASSISTED VENTRAL HERNIA: SHX6789

## 2023-12-26 LAB — GLUCOSE, CAPILLARY
Glucose-Capillary: 247 mg/dL — ABNORMAL HIGH (ref 70–99)
Glucose-Capillary: 221 mg/dL — ABNORMAL HIGH (ref 70–99)
Glucose-Capillary: 225 mg/dL — ABNORMAL HIGH (ref 70–99)

## 2023-12-26 SURGERY — REPAIR, HERNIA, VENTRAL, ROBOT-ASSISTED
Anesthesia: General | Site: Abdomen

## 2023-12-26 MED ORDER — SODIUM CHLORIDE 0.9% FLUSH
3.0000 mL | INTRAVENOUS | Status: DC | PRN
Start: 2023-12-26 — End: 2023-12-26

## 2023-12-26 MED ORDER — OXYCODONE HCL 5 MG PO TABS: 5.0000 mg | ORAL_TABLET | ORAL | Status: DC | PRN

## 2023-12-26 MED ORDER — SODIUM CHLORIDE 0.9% FLUSH: 3.0000 mL | Freq: Two times a day (BID) | INTRAVENOUS | Status: DC

## 2023-12-26 MED ORDER — ORAL CARE MOUTH RINSE: 15.0000 mL | Freq: Once | OROMUCOSAL | Status: AC

## 2023-12-26 MED ORDER — OXYCODONE HCL 5 MG PO TABS: 5.0000 mg | ORAL_TABLET | Freq: Three times a day (TID) | ORAL | 0 refills | Status: AC | PRN

## 2023-12-26 MED ORDER — FENTANYL CITRATE (PF) 250 MCG/5ML IJ SOLN
INTRAMUSCULAR | Status: AC
  Filled 2023-12-26: qty 5

## 2023-12-26 MED ORDER — PHENYLEPHRINE HCL (PRESSORS) 10 MG/ML IV SOLN
INTRAVENOUS | Status: AC
  Filled 2023-12-26: qty 1

## 2023-12-26 MED ORDER — LIDOCAINE 2% (20 MG/ML) 5 ML SYRINGE
INTRAMUSCULAR | Status: AC
  Filled 2023-12-26: qty 5

## 2023-12-26 MED ORDER — SUGAMMADEX SODIUM 200 MG/2ML IV SOLN
INTRAVENOUS | Status: DC | PRN
  Administered 2023-12-26: 200 mg via INTRAVENOUS

## 2023-12-26 MED ORDER — CHLORHEXIDINE GLUCONATE 0.12 % MT SOLN
15.0000 mL | Freq: Once | OROMUCOSAL | Status: AC
Start: 2023-12-26 — End: 2023-12-26
  Administered 2023-12-26: 15 mL via OROMUCOSAL
  Filled 2023-12-26: qty 15

## 2023-12-26 MED ORDER — CEFAZOLIN SODIUM-DEXTROSE 2-4 GM/100ML-% IV SOLN
2.0000 g | INTRAVENOUS | Status: AC
  Administered 2023-12-26: 2 g via INTRAVENOUS
  Filled 2023-12-26: qty 100

## 2023-12-26 MED ORDER — DROPERIDOL 2.5 MG/ML IJ SOLN
0.6250 mg | Freq: Once | INTRAMUSCULAR | Status: AC | PRN
  Administered 2023-12-26: 0.625 mg via INTRAVENOUS

## 2023-12-26 MED ORDER — FENTANYL CITRATE (PF) 100 MCG/2ML IJ SOLN: 25.0000 ug | INTRAMUSCULAR | Status: DC | PRN

## 2023-12-26 MED ORDER — ACETAMINOPHEN 325 MG PO TABS: 650.0000 mg | ORAL_TABLET | Freq: Four times a day (QID) | ORAL | 0 refills | Status: AC

## 2023-12-26 MED ORDER — CHLORHEXIDINE GLUCONATE CLOTH 2 % EX PADS: 6.0000 | MEDICATED_PAD | Freq: Once | CUTANEOUS | Status: DC

## 2023-12-26 MED ORDER — ACETAMINOPHEN 10 MG/ML IV SOLN: 1000.0000 mg | Freq: Once | INTRAVENOUS | Status: DC | PRN

## 2023-12-26 MED ORDER — SODIUM CHLORIDE 0.9 % IV SOLN: 250.0000 mL | INTRAVENOUS | Status: DC | PRN

## 2023-12-26 MED ORDER — LIDOCAINE 2% (20 MG/ML) 5 ML SYRINGE
INTRAMUSCULAR | Status: DC | PRN
  Administered 2023-12-26: 100 mg via INTRAVENOUS

## 2023-12-26 MED ORDER — ACETAMINOPHEN 650 MG RE SUPP
650.0000 mg | RECTAL | Status: DC | PRN
Start: 2023-12-26 — End: 2023-12-26

## 2023-12-26 MED ORDER — PROPOFOL 10 MG/ML IV BOLUS
INTRAVENOUS | Status: AC
  Filled 2023-12-26: qty 20

## 2023-12-26 MED ORDER — PHENYLEPHRINE 80 MCG/ML (10ML) SYRINGE FOR IV PUSH (FOR BLOOD PRESSURE SUPPORT)
PREFILLED_SYRINGE | INTRAVENOUS | Status: AC
  Filled 2023-12-26: qty 10

## 2023-12-26 MED ORDER — FENTANYL CITRATE (PF) 250 MCG/5ML IJ SOLN
INTRAMUSCULAR | Status: DC | PRN
  Administered 2023-12-26: 50 ug via INTRAVENOUS
  Administered 2023-12-26: 100 ug via INTRAVENOUS

## 2023-12-26 MED ORDER — MIDAZOLAM HCL 2 MG/2ML IJ SOLN
INTRAMUSCULAR | Status: AC
  Filled 2023-12-26: qty 2

## 2023-12-26 MED ORDER — CELECOXIB 200 MG PO CAPS
200.0000 mg | ORAL_CAPSULE | Freq: Once | ORAL | Status: AC
  Administered 2023-12-26: 200 mg via ORAL
  Filled 2023-12-26: qty 1

## 2023-12-26 MED ORDER — DROPERIDOL 2.5 MG/ML IJ SOLN
INTRAMUSCULAR | Status: AC
Start: 2023-12-26 — End: 2023-12-26
  Filled 2023-12-26: qty 2

## 2023-12-26 MED ORDER — BUPIVACAINE-EPINEPHRINE (PF) 0.25% -1:200000 IJ SOLN
INTRAMUSCULAR | Status: AC
  Filled 2023-12-26: qty 30

## 2023-12-26 MED ORDER — LACTATED RINGERS IV SOLN: INTRAVENOUS | Status: DC

## 2023-12-26 MED ORDER — MIDAZOLAM HCL 2 MG/2ML IJ SOLN
INTRAMUSCULAR | Status: DC | PRN
  Administered 2023-12-26: 2 mg via INTRAVENOUS

## 2023-12-26 MED ORDER — PROPOFOL 10 MG/ML IV BOLUS
INTRAVENOUS | Status: DC | PRN
  Administered 2023-12-26: 200 mg via INTRAVENOUS

## 2023-12-26 MED ORDER — BUPIVACAINE-EPINEPHRINE (PF) 0.25% -1:200000 IJ SOLN
INTRAMUSCULAR | Status: DC | PRN
  Administered 2023-12-26: 22 mL

## 2023-12-26 MED ORDER — IBUPROFEN 200 MG PO TABS: 600.0000 mg | ORAL_TABLET | Freq: Four times a day (QID) | ORAL | 0 refills | Status: AC

## 2023-12-26 MED ORDER — ROCURONIUM BROMIDE 10 MG/ML (PF) SYRINGE
PREFILLED_SYRINGE | INTRAVENOUS | Status: DC | PRN
  Administered 2023-12-26: 20 mg via INTRAVENOUS
  Administered 2023-12-26: 10 mg via INTRAVENOUS
  Administered 2023-12-26: 70 mg via INTRAVENOUS

## 2023-12-26 MED ORDER — ACETAMINOPHEN 325 MG PO TABS
650.0000 mg | ORAL_TABLET | ORAL | Status: DC | PRN
Start: 2023-12-26 — End: 2023-12-26

## 2023-12-26 MED ORDER — OXYCODONE HCL 5 MG PO TABS: 5.0000 mg | ORAL_TABLET | Freq: Once | ORAL | Status: DC | PRN

## 2023-12-26 MED ORDER — LACTATED RINGERS IV SOLN: INTRAVENOUS | Status: DC | PRN

## 2023-12-26 MED ORDER — 0.9 % SODIUM CHLORIDE (POUR BTL) OPTIME
TOPICAL | Status: DC | PRN
  Administered 2023-12-26: 1000 mL

## 2023-12-26 MED ORDER — MORPHINE SULFATE (PF) 2 MG/ML IV SOLN: 1.0000 mg | INTRAVENOUS | Status: DC | PRN

## 2023-12-26 MED ORDER — DEXAMETHASONE SODIUM PHOSPHATE 10 MG/ML IJ SOLN
INTRAMUSCULAR | Status: AC
Start: 2023-12-26 — End: 2023-12-26
  Filled 2023-12-26: qty 1

## 2023-12-26 MED ORDER — INSULIN ASPART 100 UNIT/ML IJ SOLN
0.0000 [IU] | INTRAMUSCULAR | Status: DC | PRN
  Administered 2023-12-26: 6 [IU] via SUBCUTANEOUS
  Filled 2023-12-26: qty 1

## 2023-12-26 MED ORDER — ONDANSETRON HCL 4 MG/2ML IJ SOLN
INTRAMUSCULAR | Status: AC
  Filled 2023-12-26: qty 2

## 2023-12-26 MED ORDER — ONDANSETRON HCL 4 MG/2ML IJ SOLN
INTRAMUSCULAR | Status: DC | PRN
  Administered 2023-12-26: 4 mg via INTRAVENOUS

## 2023-12-26 MED ORDER — ACETAMINOPHEN 500 MG PO TABS
1000.0000 mg | ORAL_TABLET | Freq: Once | ORAL | Status: AC
  Administered 2023-12-26: 1000 mg via ORAL
  Filled 2023-12-26: qty 2

## 2023-12-26 MED ORDER — ROCURONIUM BROMIDE 10 MG/ML (PF) SYRINGE
PREFILLED_SYRINGE | INTRAVENOUS | Status: AC
Start: 2023-12-26 — End: 2023-12-26
  Filled 2023-12-26: qty 10

## 2023-12-26 MED ORDER — OXYCODONE HCL 5 MG/5ML PO SOLN: 5.0000 mg | Freq: Once | ORAL | Status: DC | PRN

## 2023-12-26 SURGICAL SUPPLY — 49 items
BAG COUNTER SPONGE SURGICOUNT (BAG) IMPLANT
BINDER ABDOMINAL 10 UNV 27-48 (MISCELLANEOUS) IMPLANT
BINDER ABDOMINAL 12 ML 46-62 (SOFTGOODS) IMPLANT
CHLORAPREP W/TINT 26 (MISCELLANEOUS) ×1 IMPLANT
COVER MAYO STAND STRL (DRAPES) ×1 IMPLANT
COVER SURGICAL LIGHT HANDLE (MISCELLANEOUS) ×1 IMPLANT
COVER TIP SHEARS 8 DVNC (MISCELLANEOUS) ×1 IMPLANT
DEFOGGER SCOPE WARM SEASHARP (MISCELLANEOUS) ×1 IMPLANT
DERMABOND ADVANCED .7 DNX12 (GAUZE/BANDAGES/DRESSINGS) ×1 IMPLANT
DEVICE SECURE STRAP 25 ABSORB (INSTRUMENTS) IMPLANT
DEVICE TROCAR PUNCTURE CLOSURE (ENDOMECHANICALS) ×1 IMPLANT
DRAPE ARM DVNC X/XI (DISPOSABLE) ×4 IMPLANT
DRAPE COLUMN DVNC XI (DISPOSABLE) ×1 IMPLANT
DRAPE CV SPLIT W-CLR ANES SCRN (DRAPES) ×1 IMPLANT
DRAPE SURG ORHT 6 SPLT 77X108 (DRAPES) ×1 IMPLANT
DRIVER NDL MEGA 8 DVNC XI (INSTRUMENTS) ×1 IMPLANT
DRIVER NDL MEGA SUTCUT DVNCXI (INSTRUMENTS) IMPLANT
DRIVER NDLE MEGA DVNC XI (INSTRUMENTS) ×1 IMPLANT
DRIVER NDLE MEGA SUTCUT DVNCXI (INSTRUMENTS) ×1 IMPLANT
FORCEPS BPLR 8 MD DVNC XI (FORCEP) ×1 IMPLANT
GLOVE BIO SURGEON STRL SZ7 (GLOVE) ×1 IMPLANT
GOWN STRL REUS W/ TWL LRG LVL3 (GOWN DISPOSABLE) ×2 IMPLANT
GOWN STRL REUS W/ TWL XL LVL3 (GOWN DISPOSABLE) ×2 IMPLANT
GOWN STRL REUS W/TWL 2XL LVL3 (GOWN DISPOSABLE) ×1 IMPLANT
IRRIGATION SUCT STRKRFLW 2 WTP (MISCELLANEOUS) IMPLANT
KIT BASIN OR (CUSTOM PROCEDURE TRAY) ×1 IMPLANT
KIT TURNOVER KIT B (KITS) ×1 IMPLANT
MARKER SKIN DUAL TIP RULER LAB (MISCELLANEOUS) ×1 IMPLANT
MESH PROGRIP LAP SLF FIX 16X12 (Mesh General) IMPLANT
NDL HYPO 22X1.5 SAFETY MO (MISCELLANEOUS) ×1 IMPLANT
NDL INSUFFLATION 14GA 120MM (NEEDLE) IMPLANT
NEEDLE HYPO 22X1.5 SAFETY MO (MISCELLANEOUS) ×1 IMPLANT
NEEDLE INSUFFLATION 14GA 120MM (NEEDLE) ×1 IMPLANT
PAD ARMBOARD POSITIONER FOAM (MISCELLANEOUS) ×2 IMPLANT
PENCIL SMOKE EVACUATOR (MISCELLANEOUS) IMPLANT
SCISSORS LAP 5X35 DISP (ENDOMECHANICALS) IMPLANT
SCISSORS MNPLR CVD DVNC XI (INSTRUMENTS) ×1 IMPLANT
SEAL UNIV 5-12 XI (MISCELLANEOUS) ×3 IMPLANT
SET TUBE SMOKE EVAC HIGH FLOW (TUBING) ×1 IMPLANT
SPIKE FLUID TRANSFER (MISCELLANEOUS) ×1 IMPLANT
STOPCOCK 4 WAY LG BORE MALE ST (IV SETS) ×1 IMPLANT
SUT MNCRL AB 4-0 PS2 18 (SUTURE) ×1 IMPLANT
SUT STRATA PDS 2-0 23 CT-1 (SUTURE) IMPLANT
SUT VIC AB 3-0 SH 27XBRD (SUTURE) IMPLANT
SUTURE STRATFX 0 PDS+ CT-2 23 (SUTURE) IMPLANT
SUTURE STRATFX SPIRAL 3-0 PDS+ (SUTURE) IMPLANT
TOWEL GREEN STERILE FF (TOWEL DISPOSABLE) ×1 IMPLANT
TRAY LAPAROSCOPIC MC (CUSTOM PROCEDURE TRAY) ×1 IMPLANT
TROCAR XCEL NON-BLD 5MMX100MML (ENDOMECHANICALS) ×1 IMPLANT

## 2023-12-26 NOTE — H&P (Signed)
 [facility] October 04, 1975  [id number].    HPI:  48 y/o M who presents for elective umbilical hernia repair. He reports that he is in his usual state of health and denies any recent changes in medication.   ROS: Review of Systems  Constitutional: Negative.   HENT: Negative.    Eyes: Negative.   Respiratory: Negative.    Cardiovascular: Negative.   Gastrointestinal: Negative.   Genitourinary: Negative.   Musculoskeletal: Negative.   Skin: Negative.   Neurological: Negative.   Endo/Heme/Allergies: Negative.   Psychiatric/Behavioral: Negative.      Family History  Problem Relation Age of Onset   COPD Mother    Diabetes Maternal Aunt    Diabetes Paternal Uncle    Colon cancer Neg Hx    Esophageal cancer Neg Hx    Stomach cancer Neg Hx     Past Medical History:  Diagnosis Date   Anemia    takes iron supplements   Diabetes mellitus without complication (HCC)    Morbid (severe) obesity due to excess calories (HCC) 01/05/2023   bmi 41.71   Tuberculosis     Past Surgical History:  Procedure Laterality Date   VASECTOMY      Social History:  reports that he has never smoked. He has never used smokeless tobacco. He reports that he does not currently use alcohol. He reports that he does not use drugs.  Allergies: No Known Allergies  Medications Prior to Admission  Medication Sig Dispense Refill   atorvastatin  (LIPITOR) 40 MG tablet Take 1 tablet (40 mg total) by mouth daily. 90 tablet 3   cyanocobalamin  (VITAMIN B12) 1000 MCG tablet Take 1,000 mcg by mouth daily.     ferrous sulfate 325 (65 FE) MG tablet Take 325 mg by mouth every other day.     tadalafil  (CIALIS ) 10 MG tablet Take 0.5-1 tablets (5-10 mg total) by mouth every other day as needed for erectile dysfunction. 30 tablet 1   tirzepatide  (MOUNJARO ) 2.5 MG/0.5ML Pen Inject 2.5 mg into the skin once a week. 2 mL 0   Vitamin D , Ergocalciferol , (DRISDOL ) 1.25 MG (50000 UNIT) CAPS capsule TAKE 1 CAPSULE  (50,000 UNITS TOTAL) BY MOUTH EVERY 7 (SEVEN) DAYS 12 capsule 0   ZITUVIMET  XR 50-1000 MG TB24 TAKE 2 TABLETS BY MOUTH EVERY DAY 180 tablet 1   Continuous Glucose Sensor (DEXCOM G6 SENSOR) MISC Use daily as needed to check blood sugar. 3 each 0   fluticasone  (FLONASE ) 50 MCG/ACT nasal spray SPRAY 2 SPRAYS INTO EACH NOSTRIL EVERY DAY (Patient taking differently: Place 2 sprays into both nostrils daily as needed for allergies.) 48 mL 1    Physical Exam: Blood pressure 127/73, pulse 78, temperature 97.7 F (36.5 C), temperature source Oral, resp. rate 16, height 5' 5 (1.651 m), weight 111.1 kg, SpO2 98%. Gen: male, NAD Abd: soft, non-distended, umbilical bulge marked  Results for orders placed or performed during the hospital encounter of 12/26/23 (from the past 48 hours)  Glucose, capillary     Status: Abnormal   Collection Time: 12/26/23  6:25 AM  Result Value Ref Range   Glucose-Capillary 247 (H) 70 - 99 mg/dL    Comment: Glucose reference range applies only to samples taken after fasting for at least 8 hours.   Comment 1 Notify RN    No results found.  Assessment/Plan 48 y/o M w/ an umbilical hernia  - Will proceed to the OR. We discussed the alternatives and potential risks of surgery, including  but not limited to: bleeding, infection, damage to bowel or surrounding structures, mesh complications, chronic pain, recurrent hernia, and need for additional procedures. All questions were addressed and consent was obtained.    Cordella DELENA Polly Marlis Cheron Surgery 12/26/2023, 7:03 AM Please see Amion for pager number during day hours 7:00am-4:30pm or 7:00am -11:30am on weekends

## 2023-12-26 NOTE — Anesthesia Procedure Notes (Signed)
 Procedure Name: Intubation Date/Time: 12/26/2023 7:39 AM  Performed by: Obadiah Reyes BROCKS, CRNAPre-anesthesia Checklist: Patient identified, Emergency Drugs available, Suction available and Patient being monitored Patient Re-evaluated:Patient Re-evaluated prior to induction Oxygen Delivery Method: Circle System Utilized Preoxygenation: Pre-oxygenation with 100% oxygen Induction Type: IV induction Ventilation: Mask ventilation without difficulty Laryngoscope Size: Miller and 2 Grade View: Grade I Tube type: Oral Tube size: 7.5 mm Number of attempts: 1 Airway Equipment and Method: Stylet and Oral airway Placement Confirmation: ETT inserted through vocal cords under direct vision, positive ETCO2 and breath sounds checked- equal and bilateral Secured at: 22 cm Tube secured with: Tape Dental Injury: Teeth and Oropharynx as per pre-operative assessment

## 2023-12-26 NOTE — Op Note (Signed)
 Cody Marks ([id number])  Operative Report  Date 12/26/2023   PREOPERATIVE DIAGNOSIS: Ventral hernia  POSTOPERATIVE DIAGNOSIS: Same (2cm x 2cm)   PROCEDURE:  Robotic Transabdominal Preperitoneal Ventral Hernia Repair with Mesh (10 x 8.5cm Progrip Mesh)   HERNIA CHARACTERISTICS: Location: umbilical Approach: Robotic Initial/Recurrent: Initial Reducible/Incarcerated: easily reducible Mesh Implantation: Yes - 10 x 8.5cm Medtronic ProGrip Mesh Hernia Size: 2cm x 2cm  IMPLANTS: Implant Name Type Inv. Item Serial No. Manufacturer Lot No. LRB No. Used Action  MESH PROGRIP LAP SLF FIX 16X12 - [id number] Mesh General MESH PROGRIP LAP SLF FIX 16X12  COVIDIEN [id number] N/A 1 Implanted    SURGEON: Cordella Idler, MD  SURGICAL STAFF:  ANESTHESIA: General Anesthesia    INTRAOPERATIVE FINDINGS: Fat containing umbilical hernia, 2cm x 2cm. Pre-peritoneal flap created and mesh placed  ESTIMATED BLOOD LOSS: Minimal  COMPLICATIONS: None  SPECIMENS: None  OPERATIVE INDICATIONS: Pt is a 48 y.o. male who presents with a ventral hernia.  The patient desires definitive repair.  The risks, benefits and alternatives of the procedure were explained to the patient.  Risks including the risks of bleeding, infection, need for mesh removal, and potential of hernia recurrence were discussed.  The patient agreed to proceed and signed informed consent in front of a witness.   DESCRIPTION OF PROCEDURE: After preoperative identifying the patient in holding, the patient was brought to the operating room and placed supine on the operating table. SCDs were placed.  After induction of general anesthesia, the patient was given the appropriate perioperative antibiotics.  Both arms were tucked and padded to avoid potential nerve injury.  The abdomen was prepped and draped in a typical sterile fashion.  A JACHO approved time out, where the name of the patient, the operation, and intended site was confirmed.  The  procedure was begun.  An incision was made in the left upper quadrant and the Veress needle was introduced into the abdomen under standard drop technique.  The abdomen was insufflated with air.  An 8 mm left upper quadrant optical trocar was placed under direct visualization and the abdomen was inspected.  No evidence of injury was evident at the trocar site or secondary to the Veress needle.  Two additional 8 mm ports were placed within the left hemi-abdomen.  The daVinci robot was brought into the field and docked.  A 30 degree scope was placed in the middle port.  Graspers were placed in the left flank port and dissection scissors were placed in the epigastric port.  The hernia was inspected.  The hernia was noted to contain fat.  The peritoneum at the falciform ligament was opened with sharp dissection and electrocautery.  The space was developed to accommodate reduction of the hernia as well as adequate space to place our mesh.  Attention was turned to the hernia.  Adhesions to the hernia were taken down sharply and with electrocautery.  Care was taken to not injury any of the abdominal contents of the hernia.  After defining our defect, the defect was closed primarily with a running #1 Stratafix Symmetric Suture.  Next, a 10 cm x 8.5 cm piece of Medtronic ProGrip mesh was introduced in the abdomen.  The mesh was aligned appropriately with the defect to provide adequate coverage in all directions.  There was good coverage of the defect at completion.  Attention was then turned to closure of the peritoneal flap.  This was closed with a running 3-0 Stratafix Spiral Suture.  The abdomen was thoroughly inspected a  final time and hemostasis was assured.  Any suture needles were removed from the body.  The robot was undocked from the patient.  The remaining ports were removed and the abdomen was desulfated.  The port sites were closed, local anesthetic was infiltrated, and Dermabond applied.  After confirming twice  that the sponge, needle, and instrument counts were correct, the procedure was terminated, the patient was extubated and transferred to the recovery room in stable condition.  DISPOSITION: Stable to PACU.   Electronically Signed By: Cordella DELENA Idler 12/26/2023 10:06 AM

## 2023-12-26 NOTE — Transfer of Care (Signed)
 Immediate Anesthesia Transfer of Care Note  Patient: Cody Marks  Procedure(s) Performed: REPAIR, HERNIA, VENTRAL, ROBOT-ASSISTED (Abdomen)  Patient Location: PACU  Anesthesia Type:General  Level of Consciousness: awake, alert , and oriented  Airway & Oxygen Therapy: Patient Spontanous Breathing and Patient connected to face mask oxygen  Post-op Assessment: Report given to RN and Post -op Vital signs reviewed and stable  Post vital signs: Reviewed and stable  Last Vitals:  Vitals Value Taken Time  BP 117/103 12/26/23 09:50  Temp 36.1 C 12/26/23 09:50  Pulse 86 12/26/23 09:54  Resp 14 12/26/23 09:54  SpO2 97 % 12/26/23 09:54  Vitals shown include unfiled device data.  Last Pain:  Vitals:   12/26/23 0950  TempSrc:   PainSc: Asleep         Complications: No notable events documented.

## 2023-12-26 NOTE — Anesthesia Postprocedure Evaluation (Signed)
 Anesthesia Post Note  Patient: Cody Marks  Procedure(s) Performed: REPAIR, HERNIA, VENTRAL, ROBOT-ASSISTED (Abdomen)     Patient location during evaluation: PACU Anesthesia Type: General Level of consciousness: awake and alert Pain management: pain level controlled Vital Signs Assessment: post-procedure vital signs reviewed and stable Respiratory status: spontaneous breathing, nonlabored ventilation, respiratory function stable and patient connected to nasal cannula oxygen Cardiovascular status: blood pressure returned to baseline and stable Postop Assessment: no apparent nausea or vomiting Anesthetic complications: no   No notable events documented.  Last Vitals:  Vitals:   12/26/23 1015 12/26/23 1030  BP: 132/82 137/83  Pulse: 73 80  Resp: 17 20  Temp:  (!) 36.1 C  SpO2: 99% 95%    Last Pain:  Vitals:   12/26/23 0950  TempSrc:   PainSc: Asleep                 Thom JONELLE Peoples

## 2023-12-26 NOTE — Discharge Instructions (Signed)
 Home Care After Hernia Repair  Activity  Limit activity for the first 24 hours, then you may return to normal daily activities. Returning to normal daily activities as soon as you can following surgery will enhance recovery time.  No heavy lifting pushing or pulling, anything heavier than 10 pounds (gallon of milk weighs approx. 8.8 pounds) for 4-6 weeks from surgery date.  Do not mow the lawn, use a vacuum cleaner, or do any other strenuous activities without first consulting your surgical team.  Climb stairs slowly and watch your step.  Walk as often as you feel able to increase strength and endurance.  No driving or operating heavy machinery within 24 hours of taking narcotic pain medication.  Diet  Drink plenty of fluids and eat a light meal on the night of surgery. Some patients may find their appetite is poor for a week or two after surgery. This is a normal result of the stress of surgery-your appetite will return in time.   There are no specific diet restrictions after surgery.  Dressings and Wound Care  Dermabond/Durabond (skin glue): This will usually remain in place for 10-14 days, then naturally fall off your skin. You may take a shower 24 hrs after  surgery, carefully wash, not scrub the incision site with a mild non-scented soap. Pat dry with a soft towel.  Do not pick or peel skin glue off.  You can shower and let the water fall on the dressings above. Do not soak or submerge your incision(s) in a bath tub, hot tub, or swimming pool, until your doctor says it is ok to do so or the incision(s) have completely healed, usually about 2-4 weeks.  Do not use creams, powder, salves or balms on your incision(s).  What to Expect After Surgery   Moderate discomfort controlled with medications  Minimal drainage from incision  You may feel pain in one or both shoulders. This pain comes from the gas still left in your belly after the surgery, if you had laparoscopic surgery (several small  incisions). The pain should ease over several days to a week. Ambulation will help with this pain.   Belly swelling  Feeling fatigue and weak  Constipation after surgery is common. Drink plenty fluids and eat a high fiber diet.  Swelling - In some patients might feel that their hernia has returned after surgery-DO NOT Worry this is normal. Swelling may be due to the development of a seroma. Seroma is fluid that has built up where the hernia was repaired this is a normal result after surgery and it will slowly reabsorb back into your body over the next several weeks.   Pain Control: Prescribed Non-Narcotic Pain Medication  You will be given three prescriptions.  Two of them will be for prescription strength ibuprofen (i.e. Advil) and prescription strength acetaminophen (i.e. Tylenol).  The vast majority of patients will just need these two medications.  One prescription will be for a 'rescue' prescription of an oral narcotic (oxycodone).  You may fill this if needed.  You will alternate taking the ibuprofen (600mg ) every 6 hours and also the acetaminophen (650mg ) every 6 hours so that you are taking one of those medications every 3 hours.  For example: o 0800 - take ibuprofen 600mg  o 1100 - take acetaminophen 650mg  o 1400 - take ibuprofen 600mg  o 1700 - take acetaminophen 650mg  o Etc.  Continue taking this alternating pattern of ibuprofen and acetaminophen for 3 days  If you cannot take one  or the other of these medications, just take the one you can every 6 hours.  If you are comfortable at night, you don't have to wake up and take a medication.  If you are still uncomfortable after taking either ibuprofen or acetaminophen, try gentle stretching exercise and ice packs (a bag of frozen vegetables works great).  If you are still uncomfortable, you may fill the narcotic prescription of Oxycodone and take as directed.  Once you have completed these prescriptions, your pain level should be low enough  to stop taking medications altogether or just use an over the counter medication (ibuprofen or acetaminophen) as needed.   Pain Control: Over the Counter Medications to take as needed:  Colace/Docusate: May be prescribed by your surgeon to prevent constipation caused by the combination of narcotics, effects of anesthesia, and decreased ambulation.  Hold for loose stools or diarrhea. Take 100 mg 1-2 times a day starting tonight.   Fiber: High fiber foods, extra liquids (water 9-13 cups/day) can also assist with constipation. Examples of high fiber foods are fruit, bran. Prune juice and water are also good liquids to drink.  Milk of Magnesia/Miralax:  If constipated despite take the over the counter stool softeners, you may take Milk of Magnesia or Miralax as directed on bottle to assist with constipation.     Pepcid/Famotidine: May be prescribed while taking naproxen (Aleve) or other NSAIDs such as ibuprofen (Motrin/Advil) to prevent stomach upset or Acid-reflux symptoms. Take 1 tablet 1-2 times a day.   **Constipation: The first bowel movement may occur anywhere between 1-5 days after surgery.  As long as you are not nauseated or not having significant abdominal pain this variation is acceptable.  Narcotic pain medications can cause constipation increasing discomfort; early discontinuation will assist with bowel management.If constipated despite taking stool softeners, you may take Milk of Magnesia or Miralax as directed on the bottle.     **Home medications: You may restart your home medications as directed by your respective Primary Care Physician or Surgeon.  When to notify your Doctor or Healthcare Team:   Sign of Wound Infection   Fever over 100 degrees.  Wound becomes extremely swollen, shows red streaks, warm to the touch, and/or drainage from the incision site or foul-smelling drainage.  Wound edges separate or opens up  Bleeding or bruising   If you have bleeding, apply pressure to the  site and hold the pressure firmly for 5 minutes. If the bleeding continues, apply pressure again and call 911. If the bleeding stopped, call your doctor to report it.   Call your doctor or nurse if you have increased bleeding from your site and increased bruising or a lump forms or gets larger under your skin at the site.  Unrelieved Pain    Call your doctor or nurse if your pain gets worse or is not eased 1 hour after taking your pain medicine, or if it is severe and uncontrolled.  Nausea and Vomiting   Call your doctor or nurse if you have nausea and vomiting that continues more than 24 hours, will not let you keep medicine down and will not let you keep fluids down  Fever, Flu-like symptoms   Fever over 100 degrees and/or chills  Gastrointestinal Bleeding Symptoms    Black tarry bowel movements.  This can be normal after surgery on the stomach, but should resolve in a day or two.    Call 911 if you suddenly have signs of blood loss such as:  Vomiting blood  Fast heart rate  Feeling faint, sweaty, or blacking out  Passing bright red blood from your rectum  Blood Clot Symptoms   Tender, swollen or reddened areas in your calf muscle or thighs.  Numbness or tingling in your lower leg or calf, or at the top of your leg or groin  Skin on your leg looks pale or blue or feels cold to touch  Chest pain or have trouble breathing, lightheadedness, fast heart rate  Sudden Onset of Symptoms    Call 911 if you suddenly have:  Leg weakness and spasm  Loss of bladder or bowel function  Seizure  Confusion, severe headache, dizziness or feeling unsteady, problems talking, difficulty swallowing, and/or numbness or muscle weakness as these could be signs of a stroke.   Follow up Appointment Your follow up appointment should be scheduled 2-3 weeks after your surgery date.  If you have not previously scheduled for a follow-up visit you can be scheduled by contacting [phone].

## 2023-12-27 ENCOUNTER — Encounter (HOSPITAL_COMMUNITY): Payer: Self-pay | Admitting: General Surgery

## 2024-02-14 ENCOUNTER — Ambulatory Visit: Payer: Self-pay

## 2024-02-14 NOTE — Telephone Encounter (Signed)
 Noted

## 2024-02-14 NOTE — Telephone Encounter (Signed)
 FYI Only or Action Required?: FYI only for provider.  Patient was last seen in primary care on 10/19/2023 by Kennyth Worth HERO, MD.  Called Nurse Triage reporting Abdominal Pain.  Symptoms began several days ago.  Interventions attempted: Rest, hydration, or home remedies.  Symptoms are: unchanged.  Triage Disposition: See HCP Within 4 Hours (Or PCP Triage)  Patient/caregiver understands and will follow disposition?: No, refused same day, scheduled appt next day. Reason for Disposition  [1] MILD-MODERATE pain AND [2] constant AND [3] present > 2 hours  Answer Assessment - Initial Assessment Questions Pt refused same day appt, scheduled next day. ED precautions reviewed, pt verbalized understanding.   1. LOCATION: Where does it hurt?      RLQ, close to groin 2. RADIATION: Does the pain shoot anywhere else? (e.g., chest, back)     Denies 3. ONSET: When did the pain begin? (Minutes, hours or days ago)      3-4 days 4. SUDDEN: Gradual or sudden onset?     Suddenly 5. PATTERN Does the pain come and go, or is it constant?     Depends on position 6. SEVERITY: How bad is the pain?  (e.g., Scale 1-10; mild, moderate, or severe)     3-4/10, worse when bending 7. RECURRENT SYMPTOM: Have you ever had this type of stomach pain before? If Yes, ask: When was the last time? and What happened that time?      Had umbilical hernia one month ago 8. CAUSE: What do you think is causing the stomach pain? (e.g., gallstones, recent abdominal surgery)     See above 9. RELIEVING/AGGRAVATING FACTORS: What makes it better or worse? (e.g., antacids, bending or twisting motion, bowel movement)     Sitting/resting 10. OTHER SYMPTOMS: Do you have any other symptoms? (e.g., back pain, diarrhea, fever, urination pain, vomiting)       Denies  Protocols used: Abdominal Pain - Male-A-AH Copied from CRM #[id number]. Topic: Clinical - Red Word Triage >> Feb 14, 2024  8:14 AM Pinkey ORN  wrote: Red Word that prompted transfer to Nurse Triage: Pain In Right Lower Abdomen

## 2024-02-15 ENCOUNTER — Ambulatory Visit: Admitting: Family Medicine

## 2024-02-15 ENCOUNTER — Encounter: Payer: Self-pay | Admitting: Family Medicine

## 2024-02-15 VITALS — BP 132/78 | HR 89 | Temp 97.9°F | Ht 65.0 in | Wt 246.0 lb

## 2024-02-15 DIAGNOSIS — E1165 Type 2 diabetes mellitus with hyperglycemia: Secondary | ICD-10-CM | POA: Diagnosis not present

## 2024-02-15 DIAGNOSIS — R1031 Right lower quadrant pain: Secondary | ICD-10-CM

## 2024-02-15 DIAGNOSIS — Z23 Encounter for immunization: Secondary | ICD-10-CM

## 2024-02-15 DIAGNOSIS — Z7985 Long-term (current) use of injectable non-insulin antidiabetic drugs: Secondary | ICD-10-CM

## 2024-02-15 DIAGNOSIS — Z6841 Body Mass Index (BMI) 40.0 and over, adult: Secondary | ICD-10-CM

## 2024-02-15 MED ORDER — AMOXICILLIN-POT CLAVULANATE 875-125 MG PO TABS: 1.0000 | ORAL_TABLET | Freq: Two times a day (BID) | ORAL | 0 refills | Status: DC

## 2024-02-15 NOTE — Progress Notes (Signed)
 Cody Marks is a 48 y.o. male who presents today for an office visit.  Assessment/Plan:  New/Acute Problems: RLQ Abdominal Pain  No red lax.  Does have some focal tenderness on exam but no peritoneal signs.  He had a colonoscopy last year which did show right sided diverticulosis.  Given his location of pain plus diarrhea there is some concern that he may have developed acute diverticulitis.  Very low suspicion for acute appendicitis or other intra-abdominal pathology.  No evidence for hernia based on exam and his CT scan from a couple years ago showed only the umbilical hernia and a tiny left inguinal hernia without any signs of right inguinal hernia.  Will treat with a course of Augmentin.  We discussed imaging however we will treat with antibiotics first as above.  We discussed reasons to return to care.  Chronic Problems Addressed Today: Type 2 diabetes mellitus with hyperglycemia (HCC) Now on Mounjaro  5 mg weekly.  Doing well.  He will come back in a few weeks and we can recheck A1c at that time.  Also on sitagliptin  metformin  50-1000 twice daily  Morbid obesity (HCC) Weight stable since her last visit.  BMI 40.94 today with comorbidities.     Subjective:  HPI:  See assessment / plan for status of chronic conditions.    Discussed the use of AI scribe software for clinical note transcription with the patient, who gave verbal consent to proceed.  History of Present Illness Cody Marks is a 48 year old male who presents with abdominal pain and diarrhea. He is accompanied by his mother.  He has been experiencing abdominal pain since last Thursday, approximately one week ago. The pain is located in the lower abdomen and has remained consistent in intensity, rated between 2 and 3 out of 10, currently at a 2. The pain is sudden in onset and he can feel it when he coughs. No fever, chills, nausea, or vomiting are present.  He also has diarrhea since the onset of the  abdominal pain, but denies any blood in the stool. There have been no significant changes in his symptoms over the past week.  He has a history of umbilical hernia repair performed on September 2nd of the previous year, with no issues until the current episode of pain. A previous CT scan showed a left-sided hernia, and a colonoscopy from a year ago revealed diverticulosis.  He has been taking Mounjaro  for the past one and a half months after a two-month hiatus due to increased cost. He completed one dose and is on the second dose.         Objective:  Physical Exam: BP 132/78   Pulse 89   Temp 97.9 F (36.6 C) (Temporal)   Ht 5' 5 (1.651 m)   Wt 246 lb (111.6 kg)   SpO2 97%   BMI 40.94 kg/m   Wt Readings from Last 3 Encounters:  02/15/24 246 lb (111.6 kg)  12/26/23 245 lb (111.1 kg)  12/20/23 245 lb 6.4 oz (111.3 kg)    Gen: No acute distress, resting comfortably CV: Regular rate and rhythm with no murmurs appreciated Pulm: Normal work of breathing, clear to auscultation bilaterally with no crackles, wheezes, or rhonchi Abdomen: Bowel sounds present.  Mild tenderness to palpation in the right lower quadrant.  No rebound or guarding.  No appreciable bulge or masses with Valsalva. Neuro: Grossly normal, moves all extremities Psych: Normal affect and thought content      Sharise Lippy M.  Kennyth, MD 02/15/2024 2:08 PM

## 2024-02-15 NOTE — Assessment & Plan Note (Signed)
 Now on Mounjaro  5 mg weekly.  Doing well.  He will come back in a few weeks and we can recheck A1c at that time.  Also on sitagliptin  metformin  50-1000 twice daily

## 2024-02-15 NOTE — Assessment & Plan Note (Signed)
 Weight stable since her last visit.  BMI 40.94 today with comorbidities.

## 2024-02-15 NOTE — Patient Instructions (Signed)
 It was very nice to see you today!  VISIT SUMMARY: You came in today with abdominal pain and diarrhea that started about a week ago. Based on your symptoms and medical history, the doctor suspects you have diverticulitis.  YOUR PLAN: ACUTE DIVERTICULITIS: Your abdominal pain and diarrhea are likely due to diverticulitis, which is an inflammation or infection of small pouches in your colon. -You have been prescribed Augmentin for one week. Please take it as directed. -If your symptoms do not improve, we may need to do a CT scan to get a better look at what is going on.  Return if symptoms worsen or fail to improve.   Take care, Dr Kennyth  PLEASE NOTE:  If you had any lab tests, please let us  know if you have not heard back within a few days. You may see your results on mychart before we have a chance to review them but we will give you a call once they are reviewed by us .   If we ordered any referrals today, please let us  know if you have not heard from their office within the next week.   If you had any urgent prescriptions sent in today, please check with the pharmacy within an hour of our visit to make sure the prescription was transmitted appropriately.   Please try these tips to maintain a healthy lifestyle:  Eat at least 3 REAL meals and 1-2 snacks per day.  Aim for no more than 5 hours between eating.  If you eat breakfast, please do so within one hour of getting up.   Each meal should contain half fruits/vegetables, one quarter protein, and one quarter carbs (no bigger than a computer mouse)  Cut down on sweet beverages. This includes juice, soda, and sweet tea.   Drink at least 1 glass of water with each meal and aim for at least 8 glasses per day  Exercise at least 150 minutes every week.

## 2024-02-28 ENCOUNTER — Other Ambulatory Visit: Payer: Self-pay | Admitting: Family Medicine

## 2024-04-04 ENCOUNTER — Ambulatory Visit: Admitting: Family Medicine

## 2024-04-08 ENCOUNTER — Ambulatory Visit: Admitting: Family Medicine

## 2024-04-12 ENCOUNTER — Ambulatory Visit: Admitting: Family Medicine

## 2024-04-16 ENCOUNTER — Encounter: Payer: Self-pay | Admitting: Family Medicine

## 2024-04-29 ENCOUNTER — Ambulatory Visit: Admitting: Family Medicine

## 2024-05-07 ENCOUNTER — Encounter: Payer: Self-pay | Admitting: Family Medicine

## 2024-05-07 ENCOUNTER — Ambulatory Visit: Admitting: Family Medicine

## 2024-05-07 ENCOUNTER — Other Ambulatory Visit: Payer: Self-pay | Admitting: Family Medicine

## 2024-05-07 ENCOUNTER — Ambulatory Visit: Payer: Self-pay | Admitting: Family Medicine

## 2024-05-07 VITALS — BP 120/70 | HR 92 | Temp 97.4°F | Ht 65.0 in | Wt 240.0 lb

## 2024-05-07 DIAGNOSIS — N529 Male erectile dysfunction, unspecified: Secondary | ICD-10-CM

## 2024-05-07 DIAGNOSIS — E1165 Type 2 diabetes mellitus with hyperglycemia: Secondary | ICD-10-CM

## 2024-05-07 DIAGNOSIS — Z7984 Long term (current) use of oral hypoglycemic drugs: Secondary | ICD-10-CM

## 2024-05-07 DIAGNOSIS — Z6839 Body mass index (BMI) 39.0-39.9, adult: Secondary | ICD-10-CM

## 2024-05-07 LAB — POCT GLYCOSYLATED HEMOGLOBIN (HGB A1C): Hemoglobin A1C: 7.5 % — AB (ref 4.0–5.6)

## 2024-05-07 LAB — MICROALBUMIN / CREATININE URINE RATIO
Creatinine,U: 248.5 mg/dL
Microalb Creat Ratio: 8.1 mg/g (ref 0.0–30.0)
Microalb, Ur: 2 mg/dL — ABNORMAL HIGH (ref 0.7–1.9)

## 2024-05-07 MED ORDER — FREESTYLE LIBRE 3 PLUS SENSOR MISC: 3 refills | Status: AC

## 2024-05-07 MED ORDER — TIRZEPATIDE 5 MG/0.5ML ~~LOC~~ SOAJ: 5.0000 mg | SUBCUTANEOUS | 3 refills | Status: DC

## 2024-05-07 MED ORDER — TADALAFIL 10 MG PO TABS: 5.0000 mg | ORAL_TABLET | ORAL | 1 refills | Status: AC | PRN

## 2024-05-07 MED ORDER — SITAGLIPT BASE-METFORM HCL ER 50-1000 MG PO TB24: 1.0000 | ORAL_TABLET | Freq: Every day | ORAL | Status: DC

## 2024-05-07 NOTE — Assessment & Plan Note (Signed)
 A1c improved to 7.5.  He has been only on Mounjaro  2.5 mg weekly.  Will increase to 5 mg weekly.  Decrease his sitagliptin  metformin  to 50-1000 once daily.  Discussed lifestyle modifications.  Recheck A1c in 3 months.

## 2024-05-07 NOTE — Patient Instructions (Signed)
 It was very nice to see you today!  VISIT SUMMARY: Today, we discussed the management of your type 2 diabetes, numbness in your right leg, erectile dysfunction, and weight loss progress. Your A1c levels have improved, and we have made some adjustments to your medications.  YOUR PLAN: TYPE 2 DIABETES MELLITUS WITH HYPERGLYCEMIA: Your A1c levels have improved from 9.5% to 7.5%. -Increase Mounjaro  dose to 5 mg weekly. -Reduce Sitagliptin  metformin  to 500 mg once daily. -Prescribed Freestyle Libre for continuous glucose monitoring. -Recheck A1c in 3 months.  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 -Perform hip stretches and glides. -Avoid pressure on the affected area, including adjusting sleeping positions. -Continue weight loss efforts to reduce nerve pressure.  MALE ERECTILE DYSFUNCTION: Your erectile dysfunction is managed effectively with Cialis . -Refilled prescription for Cialis .  MORBID OBESITY: You have lost 6 pounds since the last visit. -Continue weight loss efforts. -Further weight loss is anticipated with the increased Mounjaro  dose.  Return in about 3 months (around 08/05/2024) for Follow Up.   Take care, Dr Kennyth  PLEASE NOTE:  If you had any lab tests, please let us  know if you have not heard back within a few days. You may see your results on mychart before we have a chance to review them but we will give you a call once they are reviewed by us .   If we ordered any referrals today, please let us  know if you have not heard from their office within the next week.   If you had any urgent prescriptions sent in today, please check with the pharmacy within an hour of our visit to make sure the prescription was transmitted appropriately.   Please try these tips to maintain a healthy lifestyle:  Eat at least 3 REAL meals and 1-2 snacks per day.  Aim for no more than 5 hours between eating.  If you eat breakfast, please  do so within one hour of getting up.   Each meal should contain half fruits/vegetables, one quarter protein, and one quarter carbs (no bigger than a computer mouse)  Cut down on sweet beverages. This includes juice, soda, and sweet tea.   Drink at least 1 glass of water with each meal and aim for at least 8 glasses per day  Exercise at least 150 minutes every week.

## 2024-05-07 NOTE — Assessment & Plan Note (Signed)
 Stable on Cialis  as needed.  Will refill today.

## 2024-05-07 NOTE — Telephone Encounter (Signed)
 Please start a PA thanks

## 2024-05-07 NOTE — Progress Notes (Signed)
 Good news! Urine sample is normal.

## 2024-05-07 NOTE — Progress Notes (Signed)
" ° °  Cody Marks is a 49 y.o. male who presents today for an office visit.  Assessment/Plan:  New/Acute Problems: Meralgia paresthetica No red flags.  No signs of sciatica or other nerve involvement.  We discussed conservative management and importance of weight loss and avoiding putting pressure to the right lateral hip.  He will let us  know if not improving or if he has any change in symptoms.  Chronic Problems Addressed Today: Type 2 diabetes mellitus with hyperglycemia (HCC) A1c improved to 7.5.  He has been only on Mounjaro  2.5 mg weekly.  Will increase to 5 mg weekly.  Decrease his sitagliptin  metformin  to 50-1000 once daily.  Discussed lifestyle modifications.  Recheck A1c in 3 months.  Erectile dysfunction Stable on Cialis  as needed.  Will refill today.  Morbid obesity (HCC) Patient down about 6 pounds since out last visit.  Discussed lifestyle modifications.      Subjective:  HPI:  See assessment / plan for status of chronic conditions.    Discussed the use of AI scribe software for clinical note transcription with the patient, who gave verbal consent to proceed.  History of Present Illness Cody Marks is a 49 year old male with type 2 diabetes who presents for management of his A1c levels.  His A1c levels have improved to 7.5 from a previous 9.5. He is currently on Mounjaro  at a dose of 2.5 mg, which he has been paying for out-of-pocket after the first month due to insurance coverage limitations. No side effects from the medication have been experienced.  He experiences numbness in his right leg, specifically in the lateral cutaneous nerve area, which occurs if he stands in one position for about ten minutes. The numbness resolves upon movement or changing position. No numbness in other areas of the leg.  He is currently taking Zutuvimet, two pills per day, and has been managing his diabetes with this regimen. He also takes cholesterol medication at night. He  has lost about six pounds since his last visit.  His glucometer, a Dexcom, was expensive, costing between $550 to $580, and he is considering switching to a [employer], pending insurance approval.         Objective:  Physical Exam: BP 120/70   Pulse 92   Temp (!) 97.4 F (36.3 C) (Temporal)   Ht 5' 5 (1.651 m)   Wt 240 lb (108.9 kg)   SpO2 96%   BMI 39.94 kg/m   Wt Readings from Last 3 Encounters:  05/07/24 240 lb (108.9 kg)  02/15/24 246 lb (111.6 kg)  12/26/23 245 lb (111.1 kg)    Gen: No acute distress, resting comfortably CV: Regular rate and rhythm with no murmurs appreciated Pulm: Normal work of breathing, clear to auscultation bilaterally with no crackles, wheezes, or rhonchi MUSCULOSKELETAL: Right leg without deformities.  Strength normal throughout. Neuro: Grossly normal, moves all extremities Psych: Normal affect and thought content      Elieser Tetrick M. Kennyth, MD 05/07/2024 8:39 AM  "

## 2024-05-07 NOTE — Assessment & Plan Note (Signed)
 Patient down about 6 pounds since out last visit.  Discussed lifestyle modifications.

## 2024-05-08 ENCOUNTER — Telehealth: Payer: Self-pay

## 2024-05-08 ENCOUNTER — Other Ambulatory Visit (HOSPITAL_COMMUNITY): Payer: Self-pay

## 2024-05-08 ENCOUNTER — Other Ambulatory Visit: Payer: Self-pay | Admitting: Family Medicine

## 2024-05-08 NOTE — Telephone Encounter (Signed)
 Pharmacy Patient Advocate Encounter   Received notification from [facility] that prior authorization for Tadalafil  10MG  tablets is required/requested.   Insurance verification completed.   The patient is insured through [facility].   Per test claim: PA required; PA submitted to above mentioned insurance via Latent Key/confirmation #/EOC [facility] Status is pending

## 2024-05-08 NOTE — Telephone Encounter (Signed)
 Pharmacy Patient Advocate Encounter   Received notification from Pt Calls Messages that prior authorization for Mounjaro  5MG /0.5ML Auto-injectors is required/requested.   Insurance verification completed.   The patient is insured through [facility].   Per test claim: Refill too soon. PA is not needed at this time. Medication was filled 04/26/24. Next eligible fill date is 05/17/24.

## 2024-05-09 ENCOUNTER — Other Ambulatory Visit (HOSPITAL_COMMUNITY): Payer: Self-pay

## 2024-05-09 NOTE — Telephone Encounter (Signed)
 Information send vie mychart

## 2024-05-09 NOTE — Telephone Encounter (Signed)
 noted

## 2024-05-09 NOTE — Telephone Encounter (Signed)
 Pharmacy Patient Advocate Encounter  Received notification from [facility] that Prior Authorization for  Tadalafil  10MG  tablets  has been APPROVED from 05/09/27 to 05/09/27. Ran test claim, Copay is $5.87. This test claim was processed through [facility]- copay amounts may vary at other pharmacies due to pharmacy/plan contracts, or as the patient moves through the different stages of their insurance plan.   PA #/Case ID/Reference #: [id number]

## 2024-05-18 ENCOUNTER — Other Ambulatory Visit: Payer: Self-pay | Admitting: Family Medicine

## 2024-05-21 ENCOUNTER — Encounter: Payer: Self-pay | Admitting: Family Medicine

## 2024-05-21 ENCOUNTER — Telehealth (INDEPENDENT_AMBULATORY_CARE_PROVIDER_SITE_OTHER): Admitting: Family Medicine

## 2024-05-21 ENCOUNTER — Telehealth: Payer: Self-pay | Admitting: *Deleted

## 2024-05-21 ENCOUNTER — Ambulatory Visit: Payer: Self-pay

## 2024-05-21 VITALS — BP 144/88 | HR 110 | Temp 98.3°F | Ht 65.0 in | Wt 240.0 lb

## 2024-05-21 DIAGNOSIS — U071 COVID-19: Secondary | ICD-10-CM

## 2024-05-21 MED ORDER — NIRMATRELVIR/RITONAVIR (PAXLOVID)TABLET: 3.0000 | ORAL_TABLET | Freq: Two times a day (BID) | ORAL | 0 refills | Status: AC

## 2024-05-21 MED ORDER — SITAGLIPT BASE-METFORM HCL ER 50-1000 MG PO TB24: 1.0000 | ORAL_TABLET | Freq: Every day | ORAL | 3 refills | Status: AC

## 2024-05-21 NOTE — Telephone Encounter (Signed)
Please schedule an appt with PCP   °

## 2024-05-21 NOTE — Telephone Encounter (Signed)
 Copied from CRM [id number]. Topic: Clinical - Medication Question >> May 21, 2024 10:35 AM Harlene ORN wrote: Reason for CRM: tested positive for Covid today 05/21/2024. Would like prescription for treatment. Currently having low grade fever.   Patient was seen today by PCP  [facility]

## 2024-05-21 NOTE — Telephone Encounter (Signed)
 FYI Only or Action Required?: FYI only for provider: given home care instructions.  Patient was last seen in primary care on 05/07/2024 by Kennyth Worth HERO, MD.  Called Nurse Triage reporting Cough.  Symptoms began 2 days ago.  Interventions attempted: Nothing.  Symptoms are: unchanged.  Triage Disposition: Home Care  Patient/caregiver understands and will follow disposition?: Unsure  Reason for Triage: Patient is having low grade fever, congestion, and cough, along with headaches and runny nose been getting worse been taking Tylenol  and it helps temporarily    Reason for Disposition  Cough with cold symptoms (e.g., runny nose, postnasal drip, throat clearing)  Answer Assessment - Initial Assessment Questions 1. ONSET: When did the cough begin?      About 2 days ago 2. SEVERITY: How bad is the cough today?      Started today, mild 3. SPUTUM: Describe the color of your sputum (e.g., none, dry cough; clear, white, yellow, green)     pale 5. DIFFICULTY BREATHING: Are you having difficulty breathing? If Yes, ask: How bad is it? (e.g., mild, moderate, severe)      denies 6. FEVER: Do you have a fever? If Yes, ask: What is your temperature, how was it measured, and when did it start?     States he has not check but feels as though it could be low grade 10. OTHER SYMPTOMS: Do you have any other symptoms? (e.g., runny nose, wheezing, chest pain)       Runny nose 12. TRAVEL: Have you traveled out of the country in the last month? (e.g., travel history, exposures)       Denies  Pt states that he has not taken at-home flu/covid test.  Protocols used: Cough - Acute Productive-A-AH

## 2024-05-21 NOTE — Progress Notes (Signed)
" ° °  Cody Marks is a 49 y.o. male who presents today for a virtual office visit.  Assessment/Plan:  COVID No red flags. Home test visualized on today's virtual visit positive for COVID. We discussed management options. Patient elected to take Paxlovid . He did this last year as well and did well. He can continue OTC medications as needed. Offered prescription for cough however he declined. HE does have some left over tessalon  that he can use.     Subjective:  HPI:  See Assessment / plan for status of chronic conditions.     Discussed the use of AI scribe software for clinical note transcription with the patient, who gave verbal consent to proceed.  History of Present Illness Cody Marks is a 49 year old male who presents with COVID-19 symptoms.  He tested positive for COVID-19 this morning using a home test strip. His symptoms began on Sunday and include cough, fever, congestion, chills, and fatigue. He has also been experiencing a runny nose, which he initially attributed to allergies.  He has been taking Claritin D for the past three days to manage what he thought were allergy symptoms. Additionally, he has been alternating between Tylenol  and ibuprofen  every three hours for the past two days to manage his symptoms, which provides temporary relief. His symptoms have remained consistent over the past few days, with some relief when the medication is active, but symptoms return once the medication wears off.  He has a history of COVID-19 infection in January of the previous year. No other household members are currently sick.         Objective/Observations  Physical Exam: Gen: NAD, resting comfortably Pulm: Normal work of breathing Neuro: Grossly normal, moves all extremities Psych: Normal affect and thought content  Virtual Visit via Video   I connected with Cody Marks on 05/21/24 at 11:20 AM EST by a video enabled telemedicine application and verified that  I am speaking with the correct person using two identifiers. The limitations of evaluation and management by telemedicine and the availability of in person appointments were discussed. The patient expressed understanding and agreed to proceed.   Patient location: Home Provider location: Home Office Persons participating in the virtual visit: Myself and Patient     Worth HERO. Kennyth, MD 05/21/2024 11:35 AM  "

## 2024-05-25 ENCOUNTER — Other Ambulatory Visit: Payer: Self-pay | Admitting: Family Medicine

## 2024-05-28 ENCOUNTER — Ambulatory Visit: Admitting: Family Medicine

## 2024-05-28 ENCOUNTER — Encounter: Payer: Self-pay | Admitting: Family Medicine

## 2024-05-28 VITALS — BP 122/70 | HR 104 | Temp 99.1°F | Wt 240.6 lb

## 2024-05-28 DIAGNOSIS — Z7985 Long-term (current) use of injectable non-insulin antidiabetic drugs: Secondary | ICD-10-CM

## 2024-05-28 DIAGNOSIS — E1165 Type 2 diabetes mellitus with hyperglycemia: Secondary | ICD-10-CM

## 2024-05-28 DIAGNOSIS — R059 Cough, unspecified: Secondary | ICD-10-CM | POA: Diagnosis not present

## 2024-05-28 DIAGNOSIS — Z7984 Long term (current) use of oral hypoglycemic drugs: Secondary | ICD-10-CM | POA: Diagnosis not present

## 2024-05-28 DIAGNOSIS — E559 Vitamin D deficiency, unspecified: Secondary | ICD-10-CM | POA: Insufficient documentation

## 2024-05-28 MED ORDER — LANCETS MISC: 1.0000 | 0 refills | Status: AC

## 2024-05-28 MED ORDER — AZITHROMYCIN 250 MG PO TABS: ORAL_TABLET | ORAL | 0 refills | Status: AC

## 2024-05-28 MED ORDER — VITAMIN D (ERGOCALCIFEROL) 1.25 MG (50000 UNIT) PO CAPS: 50000.0000 [IU] | ORAL_CAPSULE | ORAL | 3 refills | Status: AC

## 2024-05-28 MED ORDER — BLOOD GLUCOSE MONITORING SUPPL DEVI: 1.0000 | 0 refills | Status: AC

## 2024-05-28 MED ORDER — BLOOD GLUCOSE TEST VI STRP: 1.0000 | ORAL_STRIP | 0 refills | Status: AC

## 2024-05-28 MED ORDER — LANCET DEVICE MISC: 1.0000 | 0 refills | Status: AC

## 2024-05-28 NOTE — Progress Notes (Signed)
" ° °  Cody Marks is a 49 y.o. male who presents today for an office visit.  Assessment/Plan:  New/Acute Problems: Cough  Patient with persistent cough and fever over the last few days after having COVID last week.  Symptoms did improve however have really worsened over the last few days.  Concern for a bacterial secondary infection.  Will start azithromycin .  We also did discuss x-ray however will defer for today as it would not significantly change management at this time.  We encouraged hydration.  He will let us  know if not improving in the next day or 2 and would consider imaging at that time.  Chronic Problems Addressed Today: Type 2 diabetes mellitus with hyperglycemia (HCC) Most recent A1c improved to 7.5.  He is currently on Mounjaro  5 mg weekly and sitagliptin /metformin  50-1000 once daily.  Will send in glucometer today - he is ok with fingerstick as the CGM's are overly expensive.  Vitamin D  deficiency Will refill vitamin D  50000 IUs weekly.    Subjective:  HPI:  See assessment / plan for status of chronic conditions.   Discussed the use of AI scribe software for clinical note transcription with the patient, who gave verbal consent to proceed.  History of Present Illness Cody Marks is a 49 year old male who presents with persistent fever despite treatment.  He has experienced persistent fever since last Tuesday, despite starting on Vaxxer. Initially, his symptoms improved, but by Friday evening, his fever spiked to 104F. He managed the fever with Tylenol  and ibuprofen , noting a temporary reduction in temperature. However, the fever returned the next day, and he continued to take Tylenol  every six hours due to a lack of ibuprofen . He attempted to stop Tylenol  to assess his fever, but it increased again, impacting his ability to return to work. No cough, headache, ear pain, sore throat, nausea, vomiting, or urinary symptoms. He reports chills, though he does not  experience ear pain.  He mentions having an ingrown toenail, which he treated by soaking in warm water and cutting, resulting in decreased pain.  His family is reportedly doing well, with no one else being sick. He mentions challenges with his children's remote learning due to lack of school-provided laptops. He has been unable to return to work due to his fever and has already missed a week of work.         Objective:  Physical Exam: BP 122/70   Pulse (!) 104   Temp 99.1 F (37.3 C) (Temporal)   Wt 240 lb 9.6 oz (109.1 kg)   SpO2 98%   BMI 40.04 kg/m   Gen: No acute distress, resting comfortably HEENT: Right TM erythematous.  Left TM clear.  OP erythematous. CV: Regular rate and rhythm with no murmurs appreciated Pulm: Normal work of breathing, occasional rhonchi and end expiratory wheeze noted throughout all lung fields otherwise clear to auscultation. Neuro: Grossly normal, moves all extremities Psych: Normal affect and thought content      Cody Zupko M. Kennyth, MD 05/28/2024 10:58 AM  "

## 2024-05-28 NOTE — Telephone Encounter (Signed)
 Patient has an OV today with PCP.

## 2024-05-28 NOTE — Patient Instructions (Signed)
 It was very nice to see you today!  VISIT SUMMARY: During your visit, we addressed your persistent fever, potential bronchitis, type 2 diabetes management, and vitamin D  deficiency.  YOUR PLAN: PERSISTENT FEVER AND ACUTE BRONCHITIS: You have a persistent fever and symptoms suggesting bronchitis or pneumonia, possibly with a secondary bacterial infection. -You have been prescribed azithromycin . Please take it as directed. -Monitor your symptoms closely and report if there is no improvement. -If your symptoms persist, we may need to consider a chest x-ray.  TYPE 2 DIABETES MELLITUS WITH HYPERGLYCEMIA: We discussed your blood glucose monitoring options due to cost concerns. -You have been prescribed a glucometer for finger stick blood glucose monitoring. Please use it as directed to monitor your blood sugar levels.  VITAMIN D  DEFICIENCY: You have a vitamin D  deficiency. -You have been prescribed vitamin D  supplementation. Please take it as directed.  No follow-ups on file.   Take care, Dr Kennyth  PLEASE NOTE:  If you had any lab tests, please let us  know if you have not heard back within a few days. You may see your results on mychart before we have a chance to review them but we will give you a call once they are reviewed by us .   If we ordered any referrals today, please let us  know if you have not heard from their office within the next week.   If you had any urgent prescriptions sent in today, please check with the pharmacy within an hour of our visit to make sure the prescription was transmitted appropriately.   Please try these tips to maintain a healthy lifestyle:  Eat at least 3 REAL meals and 1-2 snacks per day.  Aim for no more than 5 hours between eating.  If you eat breakfast, please do so within one hour of getting up.   Each meal should contain half fruits/vegetables, one quarter protein, and one quarter carbs (no bigger than a computer mouse)  Cut down on sweet  beverages. This includes juice, soda, and sweet tea.   Drink at least 1 glass of water with each meal and aim for at least 8 glasses per day  Exercise at least 150 minutes every week.

## 2024-05-29 NOTE — Telephone Encounter (Signed)
 See note

## 2024-05-30 ENCOUNTER — Ambulatory Visit: Payer: Self-pay

## 2024-05-30 ENCOUNTER — Other Ambulatory Visit: Payer: Self-pay | Admitting: *Deleted

## 2024-05-30 ENCOUNTER — Encounter: Payer: Self-pay | Admitting: Family Medicine

## 2024-05-30 ENCOUNTER — Ambulatory Visit

## 2024-05-30 DIAGNOSIS — R059 Cough, unspecified: Secondary | ICD-10-CM

## 2024-05-30 MED ORDER — AMOXICILLIN-POT CLAVULANATE 875-125 MG PO TABS: 1.0000 | ORAL_TABLET | Freq: Two times a day (BID) | ORAL | 0 refills | Status: AC

## 2024-05-30 NOTE — Telephone Encounter (Signed)
 FYI Only or Action Required?: Action required by provider: clinical question for provider and update on patient condition.  Patient was last seen in primary care on 05/28/2024 by Kennyth Worth HERO, MD.  Called Nurse Triage reporting Fever and Cough.  Symptoms began a week ago.  Interventions attempted: OTC medications: tylenol  and ibuprofen  and Prescription medications: azythromycin.  Symptoms are: unchanged.  Triage Disposition: Call PCP Within 24 Hours  Patient/caregiver understands and will follow disposition?: Yes  Summary: fever this morning 101.2 controled by meds feeling worse   Reason for Triage: Z pack on 05/28/24 fever this morning 101.2 controled by meds feeling worse if he doesn't take Tylenol  fever goes up Diarrhea Cough  Patient hung up while on hold with nurse triage Called patient, call did not go through     Reason for Disposition  [1] Taking antibiotic > 48 hours (2 days) AND [2] fever still present (SAME)  Answer Assessment - Initial Assessment Questions Requesting CXR based on Dr. Shaune recommendations during office visit on 05/28/2024  1. INFECTION: What infection is the antibiotic being given for?     Possible bronchitis 2. ANTIBIOTIC: What antibiotic are you taking How many times per day?     azythromycin 3. DURATION: When was the antibiotic started?     05/28/2024 4. MAIN CONCERN OR SYMPTOM:  What is your main concern right now?     fever 5. BETTER-SAME-WORSE: Are you getting better, staying the same, or getting worse compared to when you first started the antibiotics? If getting worse, ask: In what way?      same 6. FEVER: Do you have a fever? If Yes, ask: What is your temperature, how was it measured, and when did it start?     102.1 this morning.  Managed with tylenol  and ibuprofen  7. SYMPTOMS: Are there any other symptoms you're concerned about? If Yes, ask: When did it start?     Cough when deep breathing, tired/fatigue.  Diarrhea r/t antibiotic use 8. FOLLOW-UP APPOINTMENT: Do you have a follow-up appointment with your doctor?     none  Protocols used: Infection on Antibiotic Follow-up Call-A-AH

## 2024-05-30 NOTE — Telephone Encounter (Signed)
 Patient aware Rx Augmentin  875-125 twice daily for 7 days [facility] to [facility]  Xray appt schedule, order placed

## 2024-05-30 NOTE — Telephone Encounter (Signed)
 Duplicated

## 2024-05-30 NOTE — Telephone Encounter (Signed)
 See previews note New Rx send to pharmacy, Chest Xray done today

## 2024-05-30 NOTE — Telephone Encounter (Signed)
 IF still having fever after 2 days of antibiotics recommend we change to Augmentin  875-125 twice daily for 7 days and have him come in for chest xray.

## 2024-08-05 ENCOUNTER — Ambulatory Visit: Admitting: Family Medicine

## 2024-10-03 ENCOUNTER — Encounter: Admitting: Family Medicine
